# Patient Record
Sex: Female | Born: 1940 | Race: White | Hispanic: No | Smoking: Never smoker
Health system: Southern US, Community
[De-identification: ages and names within clinical notes are randomized; demographics above are authoritative.]

## PROBLEM LIST (undated history)

## (undated) DIAGNOSIS — G4730 Sleep apnea, unspecified: Secondary | ICD-10-CM

## (undated) DIAGNOSIS — M1990 Unspecified osteoarthritis, unspecified site: Secondary | ICD-10-CM

## (undated) DIAGNOSIS — I10 Essential (primary) hypertension: Secondary | ICD-10-CM

## (undated) DIAGNOSIS — E039 Hypothyroidism, unspecified: Secondary | ICD-10-CM

## (undated) DIAGNOSIS — E7800 Pure hypercholesterolemia, unspecified: Secondary | ICD-10-CM

## (undated) HISTORY — PX: JOINT REPLACEMENT: SHX530

## (undated) HISTORY — PX: HAND SURGERY: SHX662

## (undated) HISTORY — PX: TONSILLECTOMY: SUR1361

---

## 2000-12-25 ENCOUNTER — Encounter: Payer: Self-pay | Admitting: General Surgery

## 2000-12-26 ENCOUNTER — Encounter (INDEPENDENT_AMBULATORY_CARE_PROVIDER_SITE_OTHER): Payer: Self-pay | Admitting: Specialist

## 2000-12-26 ENCOUNTER — Observation Stay (HOSPITAL_COMMUNITY): Admission: RE | Admit: 2000-12-26 | Discharge: 2000-12-27 | Payer: Self-pay | Admitting: General Surgery

## 2003-06-20 ENCOUNTER — Encounter: Admission: RE | Admit: 2003-06-20 | Discharge: 2003-07-19 | Payer: Self-pay | Admitting: *Deleted

## 2003-08-15 ENCOUNTER — Other Ambulatory Visit: Admission: RE | Admit: 2003-08-15 | Discharge: 2003-08-15 | Payer: Self-pay | Admitting: *Deleted

## 2003-09-28 ENCOUNTER — Ambulatory Visit (HOSPITAL_COMMUNITY): Admission: RE | Admit: 2003-09-28 | Discharge: 2003-09-28 | Payer: Self-pay | Admitting: Gastroenterology

## 2006-01-07 ENCOUNTER — Other Ambulatory Visit: Admission: RE | Admit: 2006-01-07 | Discharge: 2006-01-07 | Payer: Self-pay | Admitting: *Deleted

## 2007-07-17 ENCOUNTER — Emergency Department (HOSPITAL_COMMUNITY): Admission: EM | Admit: 2007-07-17 | Discharge: 2007-07-17 | Payer: Self-pay | Admitting: Emergency Medicine

## 2008-09-04 ENCOUNTER — Emergency Department (HOSPITAL_COMMUNITY): Admission: EM | Admit: 2008-09-04 | Discharge: 2008-09-04 | Payer: Self-pay | Admitting: Emergency Medicine

## 2009-12-27 ENCOUNTER — Inpatient Hospital Stay (HOSPITAL_COMMUNITY): Admission: AD | Admit: 2009-12-27 | Discharge: 2010-01-01 | Payer: Self-pay | Admitting: Orthopedic Surgery

## 2010-02-08 ENCOUNTER — Encounter: Admission: RE | Admit: 2010-02-08 | Discharge: 2010-05-01 | Payer: Self-pay | Admitting: Orthopedic Surgery

## 2010-02-21 ENCOUNTER — Ambulatory Visit (HOSPITAL_COMMUNITY): Admission: RE | Admit: 2010-02-21 | Discharge: 2010-02-21 | Payer: Self-pay | Admitting: Orthopedic Surgery

## 2011-03-09 LAB — BASIC METABOLIC PANEL
BUN: 11 mg/dL (ref 6–23)
BUN: 9 mg/dL (ref 6–23)
BUN: 9 mg/dL (ref 6–23)
CO2: 27 mEq/L (ref 19–32)
CO2: 28 mEq/L (ref 19–32)
CO2: 29 mEq/L (ref 19–32)
CO2: 31 mEq/L (ref 19–32)
Calcium: 8.3 mg/dL — ABNORMAL LOW (ref 8.4–10.5)
Calcium: 8.5 mg/dL (ref 8.4–10.5)
Chloride: 100 mEq/L (ref 96–112)
Chloride: 103 mEq/L (ref 96–112)
Chloride: 97 mEq/L (ref 96–112)
Chloride: 99 mEq/L (ref 96–112)
Creatinine, Ser: 0.64 mg/dL (ref 0.4–1.2)
Creatinine, Ser: 0.68 mg/dL (ref 0.4–1.2)
GFR calc Af Amer: >60 mL/min (ref >60)
GFR calc non Af Amer: >60 mL/min (ref >60)
Glucose, Bld: 110 mg/dL — ABNORMAL HIGH (ref 70–99)
Glucose, Bld: 118 mg/dL — ABNORMAL HIGH (ref 70–99)
Glucose, Bld: 122 mg/dL — ABNORMAL HIGH (ref 70–99)
Potassium: 3.8 mEq/L (ref 3.5–5.1)
Potassium: 3.9 mEq/L (ref 3.5–5.1)
Sodium: 132 mEq/L — ABNORMAL LOW (ref 135–145)
Sodium: 136 mEq/L (ref 135–145)
Sodium: 136 mEq/L (ref 135–145)

## 2011-03-09 LAB — TYPE AND SCREEN
ABO/RH(D): A NEG
Antibody Screen: NEGATIVE

## 2011-03-09 LAB — HEMOGLOBIN AND HEMATOCRIT, BLOOD
HCT: 27.3 % — ABNORMAL LOW (ref 36.0–46.0)
HCT: 29.8 % — ABNORMAL LOW (ref 36.0–46.0)
HCT: 33.6 % — ABNORMAL LOW (ref 36.0–46.0)
Hemoglobin: 11.4 g/dL — ABNORMAL LOW (ref 12.0–15.0)

## 2011-03-09 LAB — ABO/RH: ABO/RH(D): A NEG

## 2011-03-09 LAB — PROTIME-INR
INR: 1.46 (ref 0.00–1.49)
Prothrombin Time: 16.1 seconds — ABNORMAL HIGH (ref 11.6–15.2)
Prothrombin Time: 16.9 seconds — ABNORMAL HIGH (ref 11.6–15.2)

## 2011-03-15 LAB — COMPREHENSIVE METABOLIC PANEL
Albumin: 3.8 g/dL (ref 3.5–5.2)
Alkaline Phosphatase: 52 U/L (ref 39–117)
BUN: 13 mg/dL (ref 6–23)
Calcium: 9.2 mg/dL (ref 8.4–10.5)
Glucose, Bld: 113 mg/dL — ABNORMAL HIGH (ref 70–99)
Potassium: 4 mEq/L (ref 3.5–5.1)
Sodium: 138 mEq/L (ref 135–145)
Total Protein: 7.6 g/dL (ref 6.0–8.3)

## 2011-03-15 LAB — DIFFERENTIAL
Basophils Relative: 1 % (ref 0–1)
Lymphs Abs: 1.8 10*3/uL (ref 0.7–4.0)
Monocytes Absolute: 0.5 10*3/uL (ref 0.1–1.0)
Monocytes Relative: 7 % (ref 3–12)
Neutro Abs: 4 10*3/uL (ref 1.7–7.7)
Neutrophils Relative %: 62 % (ref 43–77)

## 2011-03-15 LAB — CBC
HCT: 35.8 % — ABNORMAL LOW (ref 36.0–46.0)
MCHC: 33.7 g/dL (ref 30.0–36.0)
Platelets: 256 10*3/uL (ref 150–400)
RDW: 15.1 % (ref 11.5–15.5)

## 2011-03-15 LAB — URINE MICROSCOPIC-ADD ON

## 2011-03-15 LAB — URINALYSIS, ROUTINE W REFLEX MICROSCOPIC
Glucose, UA: NEGATIVE mg/dL
Leukocytes, UA: NEGATIVE
Nitrite: NEGATIVE
pH: 6.5 (ref 5.0–8.0)

## 2011-03-15 LAB — APTT: aPTT: 32 seconds (ref 24–37)

## 2011-03-15 LAB — PROTIME-INR: INR: 1.06 (ref 0.00–1.49)

## 2011-03-25 LAB — DIFFERENTIAL
Basophils Absolute: 0.1 10*3/uL (ref 0.0–0.1)
Basophils Relative: 1 % (ref 0–1)
Eosinophils Absolute: 0.2 10*3/uL (ref 0.0–0.7)
Monocytes Relative: 8 % (ref 3–12)
Neutro Abs: 3.6 10*3/uL (ref 1.7–7.7)
Neutrophils Relative %: 55 % (ref 43–77)

## 2011-03-25 LAB — COMPREHENSIVE METABOLIC PANEL
ALT: 47 U/L — ABNORMAL HIGH (ref 0–35)
Alkaline Phosphatase: 49 U/L (ref 39–117)
BUN: 14 mg/dL (ref 6–23)
CO2: 26 mEq/L (ref 19–32)
Chloride: 105 mEq/L (ref 96–112)
Glucose, Bld: 115 mg/dL — ABNORMAL HIGH (ref 70–99)
Potassium: 4 mEq/L (ref 3.5–5.1)
Sodium: 137 mEq/L (ref 135–145)
Total Bilirubin: 0.9 mg/dL (ref 0.3–1.2)
Total Protein: 7.7 g/dL (ref 6.0–8.3)

## 2011-03-25 LAB — URINALYSIS, ROUTINE W REFLEX MICROSCOPIC
Bilirubin Urine: NEGATIVE
Ketones, ur: NEGATIVE mg/dL
Protein, ur: NEGATIVE mg/dL
Specific Gravity, Urine: 1.013 (ref 1.005–1.030)
Urobilinogen, UA: 0.2 mg/dL (ref 0.0–1.0)

## 2011-03-25 LAB — PROTIME-INR: INR: 0.95 (ref 0.00–1.49)

## 2011-03-25 LAB — CBC
HCT: 36.8 % (ref 36.0–46.0)
Hemoglobin: 12.6 g/dL (ref 12.0–15.0)
RBC: 4.36 MIL/uL (ref 3.87–5.11)
RDW: 14.1 % (ref 11.5–15.5)
WBC: 6.5 10*3/uL (ref 4.0–10.5)

## 2011-03-25 LAB — URINE MICROSCOPIC-ADD ON

## 2011-05-10 NOTE — Op Note (Signed)
   NAME:  Mary Hayes, HERRIG NO.:  [id number]   MEDICAL RECORD NO.:  [id number]                   PATIENT TYPE:  AMB   LOCATION:  ENDO                                 FACILITY:  [facility]   PHYSICIAN:  James L. Malon Kindle., M.D.          DATE OF BIRTH:  August 18, 1941   DATE OF PROCEDURE:  09/28/2003  DATE OF DISCHARGE:                                 OPERATIVE REPORT   PROCEDURE:  Colonoscopy.   MEDICATIONS:  Fentanyl 75 mcg, Versed 6 mg IV.   SCOPE:  Olympus pediatric colonoscope.   INDICATIONS FOR PROCEDURE:  Colon cancer screening.   DESCRIPTION OF PROCEDURE:  The procedure had been explained to the patient  and consent obtained. With the patient in the left lateral decubitus  position, the Olympus pediatric video colonoscope was inserted and advanced.  We were able to advance to the cecum without difficulty. The ileocecal valve  and appendiceal orifice were seen. The terminal ileum was entered for a  short distance and was normal. The scope was withdrawn and the cecum,  ascending colon,  transverse colon, descending and sigmoid colon were seen  well, no polyps were seen. No significant diverticular disease. The rectum  was free of polyps.  The scope was withdrawn. The patient tolerated the  procedure well and was maintained on low flow oxygen and pulse oximeter  throughout the procedure.   ASSESSMENT:  Normal screening colonoscopy.   PLAN:  Will recommend yearly Hemoccults and consider another exam in 5-10  years.                                               James L. Malon Kindle., M.D.    Waldron Session  D:  09/28/2003  T:  09/28/2003  Job:  [id number]   cc:   Al Decant. Janey Greaser, MD  [address]  [place]  [place] [address]  Fax: [phone]

## 2011-05-10 NOTE — Op Note (Signed)
[facility]  Patient:    Mary Hayes, Mary Hayes                      MRN: [id number] Proc. Date: 12/26/00 Adm. Date:  [id number] Attending:  Tempie Donning                           Operative Report  OPERATIVE PROCEDURE:  Laparoscopic cholecystectomy.  SURGEON:  Gita Kudo, M.D.  ASSISTANT:  Lorne Skeens. Hoxworth, M.D.  ANESTHESIA:  General endotracheal.  PREOPERATIVE DIAGNOSIS:  Cholecystitis.  POSTOPERATIVE DIAGNOSIS:  Cholecystitis.  CLINICAL SUMMARY:  A 70 year old female admitted for elective cholecystectomy. She has had abdominal pain.  Her gallbladder ultrasound shows stones.  Liver function studies are normal.  OPERATIVE FINDINGS:  The gallbladder was thin walled and had a fair amount of fat around it, as well as the liver looking fatty.  The cystic duct and artery were normal in size and anatomy.  DESCRIPTION OF PROCEDURE:  Under satisfactory general endotracheal anesthesia, the patients abdomen was prepped and draped in a standard fashion.  She received 1.0 g Ancef preoperatively.  A transverse incision was made above the umbilicus in the midline and opened into the peritoneum.  Controlled with a figure-of-eight 0 Vicryl suture and operating Hasson port inserted and secured.  Good CO2 pneumoperitoneum was established and a cannula was placed. Then through sites infiltrated with Marcaine, two #5 ports were placed laterally and a second #10 port medially.  Graspers in the lateral port gave excellent exposure and operating through the medial port, I carefully identified the cystic duct/gallbladder junction.  Careful dissection with a right angle clamp was used to make sure of the anatomy.  Then the cystic duct and artery were controlled with multiple metal clips and divided between the distal two.  The gallbladder was then removed from below upward using the coagulating spatula for hemostasis and dissection.  A few pinpoint holes  were made in the gallbladder and the dissection was continued.  There was very minimal spillage of clear bile without any stone. After the gallbladder was removed from the liver bed, an endocatch bag was placed into the abdomen and used to hold the gallbladder securely from further spillage.  Then the liver bulb was checked for hemostasis which was made dry by cautery and the abdomen lavaged with a liter of warm saline.  The gallbladder was then removed through the abdominal port after the camera was moved to the upper port.  This was done without spillage or problem.  A second liter of warm fluid was then used to irrigate the abdomen and the returns were totally clear.  Then the ports were removed under direct vision and the CO2 released.  The midline incision was then infiltrated with Marcaine and closed with the previously placed figure-of-eight suture, as well as a second interrupted 0 Vicryl suture.  Then the subcu was approximated with 4-0 Vicryl and Steri-Strips placed in all wounds.  A sterile absorbent dressing was then applied.  The patient went to the recovery room from the operating room in good condition.  The sponge and needle counts were correct. DD:  12/26/00 TD:  12/26/00 Job: [id number]

## 2011-06-13 ENCOUNTER — Other Ambulatory Visit (HOSPITAL_COMMUNITY): Payer: Self-pay | Admitting: Orthopedic Surgery

## 2011-06-13 DIAGNOSIS — T84038A Mechanical loosening of other internal prosthetic joint, initial encounter: Secondary | ICD-10-CM

## 2011-06-13 DIAGNOSIS — M25561 Pain in right knee: Secondary | ICD-10-CM

## 2011-06-18 ENCOUNTER — Encounter (HOSPITAL_COMMUNITY)
Admission: RE | Admit: 2011-06-18 | Discharge: 2011-06-18 | Disposition: A | Discharge status: Home / Self Care | Payer: Medicare Other | Source: Ambulatory Visit | Attending: Orthopedic Surgery | Admitting: Orthopedic Surgery

## 2011-06-18 DIAGNOSIS — M25569 Pain in unspecified knee: Secondary | ICD-10-CM | POA: Insufficient documentation

## 2011-06-18 DIAGNOSIS — T84039A Mechanical loosening of unspecified internal prosthetic joint, initial encounter: Secondary | ICD-10-CM | POA: Insufficient documentation

## 2011-06-18 DIAGNOSIS — M25561 Pain in right knee: Secondary | ICD-10-CM

## 2011-06-18 DIAGNOSIS — T84038A Mechanical loosening of other internal prosthetic joint, initial encounter: Secondary | ICD-10-CM

## 2011-06-18 DIAGNOSIS — Z96659 Presence of unspecified artificial knee joint: Secondary | ICD-10-CM | POA: Insufficient documentation

## 2011-06-18 MED ORDER — TECHNETIUM TC 99M MEDRONATE IV KIT
25.0000 | PACK | Freq: Once | INTRAVENOUS | Status: AC | PRN
  Administered 2011-06-18: 25.9 via INTRAVENOUS

## 2013-10-22 ENCOUNTER — Other Ambulatory Visit: Payer: Self-pay | Admitting: Family Medicine

## 2013-10-27 ENCOUNTER — Ambulatory Visit
Admission: RE | Admit: 2013-10-27 | Discharge: 2013-10-27 | Disposition: A | Discharge status: Home / Self Care | Payer: Medicare Other | Source: Ambulatory Visit | Attending: Family Medicine | Admitting: Family Medicine

## 2016-02-19 ENCOUNTER — Emergency Department (HOSPITAL_COMMUNITY)
Admission: EM | Admit: 2016-02-19 | Discharge: 2016-02-19 | Disposition: A | Discharge status: Home / Self Care | Payer: Self-pay | Attending: Emergency Medicine | Admitting: Emergency Medicine

## 2016-02-19 ENCOUNTER — Encounter (HOSPITAL_COMMUNITY): Payer: Self-pay | Admitting: Emergency Medicine

## 2016-02-19 DIAGNOSIS — Y9289 Other specified places as the place of occurrence of the external cause: Secondary | ICD-10-CM | POA: Insufficient documentation

## 2016-02-19 DIAGNOSIS — Y998 Other external cause status: Secondary | ICD-10-CM | POA: Insufficient documentation

## 2016-02-19 DIAGNOSIS — I10 Essential (primary) hypertension: Secondary | ICD-10-CM | POA: Insufficient documentation

## 2016-02-19 DIAGNOSIS — Y9389 Activity, other specified: Secondary | ICD-10-CM | POA: Insufficient documentation

## 2016-02-19 DIAGNOSIS — W540XXA Bitten by dog, initial encounter: Secondary | ICD-10-CM | POA: Insufficient documentation

## 2016-02-19 DIAGNOSIS — S61451A Open bite of right hand, initial encounter: Secondary | ICD-10-CM | POA: Insufficient documentation

## 2016-02-19 HISTORY — DX: Pure hypercholesterolemia, unspecified: E78.00

## 2016-02-19 HISTORY — DX: Unspecified osteoarthritis, unspecified site: M19.90

## 2016-02-19 HISTORY — DX: Essential (primary) hypertension: I10

## 2016-02-19 NOTE — ED Notes (Addendum)
Called for Pt in lobby.  No response.

## 2016-02-19 NOTE — ED Notes (Signed)
RN called for Pt in lobby with no response

## 2016-02-19 NOTE — ED Notes (Signed)
Called for Pt in lobby with no response

## 2016-02-19 NOTE — ED Notes (Signed)
Pt presents with dog bite to right palm, 1" laceration noted to palmar surface, bleeding is controlled at this time.  No additional injury.  Dog is vaccinated per pt report.

## 2016-12-24 DIAGNOSIS — Z789 Other specified health status: Secondary | ICD-10-CM | POA: Diagnosis not present

## 2016-12-24 DIAGNOSIS — I10 Essential (primary) hypertension: Secondary | ICD-10-CM | POA: Diagnosis not present

## 2017-01-15 DIAGNOSIS — I10 Essential (primary) hypertension: Secondary | ICD-10-CM | POA: Diagnosis not present

## 2017-01-15 DIAGNOSIS — M25511 Pain in right shoulder: Secondary | ICD-10-CM | POA: Diagnosis not present

## 2017-01-31 DIAGNOSIS — G4733 Obstructive sleep apnea (adult) (pediatric): Secondary | ICD-10-CM | POA: Diagnosis not present

## 2017-02-04 DIAGNOSIS — M25511 Pain in right shoulder: Secondary | ICD-10-CM | POA: Diagnosis not present

## 2017-02-04 DIAGNOSIS — M12811 Other specific arthropathies, not elsewhere classified, right shoulder: Secondary | ICD-10-CM | POA: Diagnosis not present

## 2017-02-04 DIAGNOSIS — M19011 Primary osteoarthritis, right shoulder: Secondary | ICD-10-CM | POA: Diagnosis not present

## 2017-02-19 DIAGNOSIS — I10 Essential (primary) hypertension: Secondary | ICD-10-CM | POA: Diagnosis not present

## 2017-03-03 DIAGNOSIS — M12811 Other specific arthropathies, not elsewhere classified, right shoulder: Secondary | ICD-10-CM | POA: Diagnosis not present

## 2017-04-24 DIAGNOSIS — E559 Vitamin D deficiency, unspecified: Secondary | ICD-10-CM | POA: Diagnosis not present

## 2017-04-24 DIAGNOSIS — R899 Unspecified abnormal finding in specimens from other organs, systems and tissues: Secondary | ICD-10-CM | POA: Diagnosis not present

## 2017-04-24 DIAGNOSIS — R311 Benign essential microscopic hematuria: Secondary | ICD-10-CM | POA: Diagnosis not present

## 2017-04-24 DIAGNOSIS — R7301 Impaired fasting glucose: Secondary | ICD-10-CM | POA: Diagnosis not present

## 2017-04-24 DIAGNOSIS — M1990 Unspecified osteoarthritis, unspecified site: Secondary | ICD-10-CM | POA: Diagnosis not present

## 2017-04-24 DIAGNOSIS — I10 Essential (primary) hypertension: Secondary | ICD-10-CM | POA: Diagnosis not present

## 2017-04-24 DIAGNOSIS — M810 Age-related osteoporosis without current pathological fracture: Secondary | ICD-10-CM | POA: Diagnosis not present

## 2017-04-24 DIAGNOSIS — Z0000 Encounter for general adult medical examination without abnormal findings: Secondary | ICD-10-CM | POA: Diagnosis not present

## 2017-04-24 DIAGNOSIS — G4730 Sleep apnea, unspecified: Secondary | ICD-10-CM | POA: Diagnosis not present

## 2017-04-24 DIAGNOSIS — E782 Mixed hyperlipidemia: Secondary | ICD-10-CM | POA: Diagnosis not present

## 2017-04-24 DIAGNOSIS — K760 Fatty (change of) liver, not elsewhere classified: Secondary | ICD-10-CM | POA: Diagnosis not present

## 2017-05-07 DIAGNOSIS — G4733 Obstructive sleep apnea (adult) (pediatric): Secondary | ICD-10-CM | POA: Diagnosis not present

## 2017-05-21 DIAGNOSIS — M1990 Unspecified osteoarthritis, unspecified site: Secondary | ICD-10-CM | POA: Diagnosis not present

## 2017-05-21 DIAGNOSIS — R220 Localized swelling, mass and lump, head: Secondary | ICD-10-CM | POA: Diagnosis not present

## 2017-06-04 DIAGNOSIS — M810 Age-related osteoporosis without current pathological fracture: Secondary | ICD-10-CM | POA: Diagnosis not present

## 2017-08-01 DIAGNOSIS — Z1231 Encounter for screening mammogram for malignant neoplasm of breast: Secondary | ICD-10-CM | POA: Diagnosis not present

## 2017-08-06 DIAGNOSIS — M810 Age-related osteoporosis without current pathological fracture: Secondary | ICD-10-CM | POA: Diagnosis not present

## 2017-11-07 DIAGNOSIS — M810 Age-related osteoporosis without current pathological fracture: Secondary | ICD-10-CM | POA: Diagnosis not present

## 2017-11-11 DIAGNOSIS — G4733 Obstructive sleep apnea (adult) (pediatric): Secondary | ICD-10-CM | POA: Diagnosis not present

## 2017-11-18 DIAGNOSIS — M79604 Pain in right leg: Secondary | ICD-10-CM | POA: Diagnosis not present

## 2017-11-21 DIAGNOSIS — M1712 Unilateral primary osteoarthritis, left knee: Secondary | ICD-10-CM | POA: Diagnosis not present

## 2017-11-21 DIAGNOSIS — M5137 Other intervertebral disc degeneration, lumbosacral region: Secondary | ICD-10-CM | POA: Diagnosis not present

## 2017-11-21 DIAGNOSIS — M5416 Radiculopathy, lumbar region: Secondary | ICD-10-CM | POA: Diagnosis not present

## 2017-12-05 DIAGNOSIS — M1711 Unilateral primary osteoarthritis, right knee: Secondary | ICD-10-CM | POA: Diagnosis not present

## 2017-12-05 DIAGNOSIS — M5137 Other intervertebral disc degeneration, lumbosacral region: Secondary | ICD-10-CM | POA: Diagnosis not present

## 2017-12-05 DIAGNOSIS — M1712 Unilateral primary osteoarthritis, left knee: Secondary | ICD-10-CM | POA: Diagnosis not present

## 2017-12-12 ENCOUNTER — Emergency Department (HOSPITAL_COMMUNITY): Payer: PPO

## 2017-12-12 ENCOUNTER — Emergency Department (HOSPITAL_COMMUNITY)
Admission: EM | Admit: 2017-12-12 | Discharge: 2017-12-12 | Disposition: A | Discharge status: Home / Self Care | Payer: PPO | Attending: Emergency Medicine | Admitting: Emergency Medicine

## 2017-12-12 ENCOUNTER — Other Ambulatory Visit: Payer: Self-pay

## 2017-12-12 ENCOUNTER — Encounter (HOSPITAL_COMMUNITY): Payer: Self-pay | Admitting: Emergency Medicine

## 2017-12-12 DIAGNOSIS — Z79899 Other long term (current) drug therapy: Secondary | ICD-10-CM | POA: Insufficient documentation

## 2017-12-12 DIAGNOSIS — I10 Essential (primary) hypertension: Secondary | ICD-10-CM | POA: Diagnosis not present

## 2017-12-12 DIAGNOSIS — M542 Cervicalgia: Secondary | ICD-10-CM

## 2017-12-12 DIAGNOSIS — M545 Low back pain: Secondary | ICD-10-CM | POA: Diagnosis not present

## 2017-12-12 LAB — CBC WITH DIFFERENTIAL/PLATELET
BASOS PCT: 0 %
Basophils Absolute: 0 10*3/uL (ref 0.0–0.1)
EOS ABS: 0.1 10*3/uL (ref 0.0–0.7)
Eosinophils Relative: 1 %
HCT: 38 % (ref 36.0–46.0)
Hemoglobin: 12.8 g/dL (ref 12.0–15.0)
LYMPHS ABS: 2.7 10*3/uL (ref 0.7–4.0)
Lymphocytes Relative: 20 %
MCH: 29 pg (ref 26.0–34.0)
MCHC: 33.7 g/dL (ref 30.0–36.0)
MCV: 86.2 fL (ref 78.0–100.0)
MONO ABS: 1.5 10*3/uL — AB (ref 0.1–1.0)
MONOS PCT: 11 %
NEUTROS PCT: 68 %
Neutro Abs: 9.5 10*3/uL — ABNORMAL HIGH (ref 1.7–7.7)
Platelets: 255 10*3/uL (ref 150–400)
RBC: 4.41 MIL/uL (ref 3.87–5.11)
RDW: 13.6 % (ref 11.5–15.5)
WBC: 13.8 10*3/uL — ABNORMAL HIGH (ref 4.0–10.5)

## 2017-12-12 LAB — MAGNESIUM: Magnesium: 2 mg/dL (ref 1.7–2.4)

## 2017-12-12 LAB — BASIC METABOLIC PANEL
Anion gap: 8 (ref 5–15)
BUN: 11 mg/dL (ref 6–20)
CALCIUM: 8.9 mg/dL (ref 8.9–10.3)
CO2: 25 mmol/L (ref 22–32)
CREATININE: 0.62 mg/dL (ref 0.44–1.00)
Chloride: 104 mmol/L (ref 101–111)
GFR calc Af Amer: >60 mL/min (ref >60)
GFR calc non Af Amer: >60 mL/min (ref >60)
Glucose, Bld: 140 mg/dL — ABNORMAL HIGH (ref 65–99)
Potassium: 3.9 mmol/L (ref 3.5–5.1)
SODIUM: 137 mmol/L (ref 135–145)

## 2017-12-12 MED ORDER — ACETAMINOPHEN 500 MG PO TABS
1000.0000 mg | ORAL_TABLET | Freq: Once | ORAL | Status: AC
  Administered 2017-12-12: 1000 mg via ORAL
  Filled 2017-12-12: qty 2

## 2017-12-12 MED ORDER — GADOBENATE DIMEGLUMINE 529 MG/ML IV SOLN: 20.0000 mL | Freq: Once | INTRAVENOUS | Status: DC | PRN

## 2017-12-12 MED ORDER — KETOROLAC TROMETHAMINE 30 MG/ML IJ SOLN
30.0000 mg | Freq: Once | INTRAMUSCULAR | Status: AC
  Administered 2017-12-12: 30 mg via INTRAVENOUS
  Filled 2017-12-12: qty 1

## 2017-12-12 MED ORDER — HYDROMORPHONE HCL 1 MG/ML IJ SOLN: 1.0000 mg | Freq: Once | INTRAMUSCULAR | Status: DC

## 2017-12-12 MED ORDER — METHYLPREDNISOLONE 4 MG PO TBPK
ORAL_TABLET | ORAL | 0 refills | Status: DC
Start: 2017-12-12 — End: 2019-01-13

## 2017-12-12 MED ORDER — DEXAMETHASONE SODIUM PHOSPHATE 10 MG/ML IJ SOLN
10.0000 mg | Freq: Once | INTRAMUSCULAR | Status: AC
  Administered 2017-12-12: 10 mg via INTRAVENOUS
  Filled 2017-12-12: qty 1

## 2017-12-12 MED ORDER — HYDROMORPHONE HCL 1 MG/ML IJ SOLN
0.5000 mg | Freq: Once | INTRAMUSCULAR | Status: AC
  Administered 2017-12-12: 0.5 mg via INTRAVENOUS
  Filled 2017-12-12: qty 1

## 2017-12-12 MED ORDER — METHOCARBAMOL 500 MG PO TABS: 500.0000 mg | ORAL_TABLET | Freq: Two times a day (BID) | ORAL | 0 refills | Status: DC

## 2017-12-12 MED ORDER — METHOCARBAMOL 500 MG PO TABS
1000.0000 mg | ORAL_TABLET | Freq: Once | ORAL | Status: AC
  Administered 2017-12-12: 1000 mg via ORAL
  Filled 2017-12-12: qty 2

## 2017-12-12 MED ORDER — NAPROXEN 375 MG PO TABS: 375.0000 mg | ORAL_TABLET | Freq: Two times a day (BID) | ORAL | 0 refills | Status: DC

## 2017-12-12 NOTE — ED Notes (Signed)
Pt transported to MRI 

## 2017-12-12 NOTE — ED Provider Notes (Signed)
This is a 76 year old female signed out to me at change of shift by Abigail Butts with MRI pending.  Please see her note for more details on history, physical, review of systems and medical decision making.  Briefly this is a 76 year old female presenting for bilateral neck pain times 2 days.  She reports that initially began on the left side but yesterday spread to the right side.  She has been using the lidocaine patches, arthritis cream, tramadol and Zanaflex without relief.  She is also tried heat and ice along with stretching.  She is now unable to turn her head due to the pain.  There is noted to be bilateral paraspinal tenderness.  No neurologic deficits noted and patient has intact strength of upper and lower extremities.  No loss of bowel or bladder.  No urinary retention.  X-ray of cervical spine negative.  MRI obtained for potential epidural hematoma or abscess.   Physical Exam  BP (!) 168/76 (BP Location: Right Arm)   Pulse 99   Temp 99.1 F (37.3 C) (Oral)   Resp 20   Ht 5\' 6"  (1.676 m)   Wt 88.5 kg (195 lb)   SpO2 98%   BMI 31.47 kg/m   Physical Exam  Constitutional: She appears well-developed and well-nourished. No distress.  Non-toxic appearing  HENT:  Head: Normocephalic and atraumatic.  Right Ear: External ear normal.  Left Ear: External ear normal.  Neck:  No Bruits  Cardiovascular: Normal rate, regular rhythm, normal heart sounds and intact distal pulses.  No murmur heard. Pulses:      Radial pulses are 2+ on the right side, and 2+ on the left side.       Femoral pulses are 2+ on the right side, and 2+ on the left side.      Dorsalis pedis pulses are 2+ on the right side, and 2+ on the left side.       Posterior tibial pulses are 2+ on the right side, and 2+ on the left side.  Pulmonary/Chest: Effort normal and breath sounds normal. No respiratory distress.  Abdominal: Soft. Bowel sounds are normal. She exhibits no pulsatile midline mass. There is no  tenderness. There is no rigidity, no rebound and no CVA tenderness.  Musculoskeletal:  Posterior and appearance appears normal. No evidence of obvious scoliosis or kyphosis. No obvious signs of skin changes, trauma, deformity, infection. No C spine tenderness or step-offs to palpation. Bilateral paraspinal TTP. Lung expansion normal. Bilateral lower extremity strength 5 out of 5. Upper extremity strength 5/5. Bicceps, Patellar and Achilles deep tendon reflex 2+ and equal bilaterally. Sensation of upper and lower extremities grossly intact. Eextremity compartments soft. Radial, PT and DP 2+ b/l. Cap refill <2 seconds.   Neurological: She is alert.  Skin: Skin is warm, dry and intact. Capillary refill takes less than 2 seconds. No rash noted. She is not diaphoretic. No erythema.  Nursing note and vitals reviewed.   ED Course/Procedures     Procedures Results for orders placed or performed during the hospital encounter of 12/12/17  CBC with Differential  Result Value Ref Range   WBC 13.8 (H) 4.0 - 10.5 K/uL   RBC 4.41 3.87 - 5.11 MIL/uL   Hemoglobin 12.8 12.0 - 15.0 g/dL   HCT 38.0 36.0 - 46.0 %   MCV 86.2 78.0 - 100.0 fL   MCH 29.0 26.0 - 34.0 pg   MCHC 33.7 30.0 - 36.0 g/dL   RDW 13.6 11.5 - 15.5 %  Platelets 255 150 - 400 K/uL   Neutrophils Relative % 68 %   Neutro Abs 9.5 (H) 1.7 - 7.7 K/uL   Lymphocytes Relative 20 %   Lymphs Abs 2.7 0.7 - 4.0 K/uL   Monocytes Relative 11 %   Monocytes Absolute 1.5 (H) 0.1 - 1.0 K/uL   Eosinophils Relative 1 %   Eosinophils Absolute 0.1 0.0 - 0.7 K/uL   Basophils Relative 0 %   Basophils Absolute 0.0 0.0 - 0.1 K/uL  Basic metabolic panel  Result Value Ref Range   Sodium 137 135 - 145 mmol/L   Potassium 3.9 3.5 - 5.1 mmol/L   Chloride 104 101 - 111 mmol/L   CO2 25 22 - 32 mmol/L   Glucose, Bld 140 (H) 65 - 99 mg/dL   BUN 11 6 - 20 mg/dL   Creatinine, Ser 0.62 0.44 - 1.00 mg/dL   Calcium 8.9 8.9 - 10.3 mg/dL   GFR calc non Af Amer >60 >60  mL/min   GFR calc Af Amer >60 >60 mL/min   Anion gap 8 5 - 15  Magnesium  Result Value Ref Range   Magnesium 2.0 1.7 - 2.4 mg/dL   Dg Cervical Spine Complete  Result Date: 12/12/2017 CLINICAL DATA:  Torticollis.  Posterior neck pain for 2 days. EXAM: CERVICAL SPINE - COMPLETE 4+ VIEW COMPARISON:  None. FINDINGS: Cervical spine alignment is maintained. Vertebral body heights are preserved. The dens is intact. Disc space narrowing and endplate spurring Z0-C5 and C6-C7. Multilevel facet arthropathy. Mild bony neural foraminal stenosis at C3-C4 on the right and C4-C5 on the left. There is no evidence of fracture. No prevertebral soft tissue edema. 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 No evidence acute abnormality. Electronically Signed   By: Jeb Levering M.D.   On: 12/12/2017 06:18   Dg Lumbar Spine Complete  Result Date: 12/12/2017 CLINICAL DATA:  Radicular pain in the right leg. New onset back pain 2 days ago. No known injury. EXAM: LUMBAR SPINE - COMPLETE 4+ VIEW COMPARISON:  Reformats from abdominal CT 11/05/2010 FINDINGS: T12 compression fracture. Lumbar vertebral body heights are preserved. Multilevel endplate spurring with preservation of disc spaces. Advanced facet arthropathy in the lower lumbar spine. No evidence of acute fracture. The bones are under mineralized. Sacroiliac joints are congruent with degenerative change. IMPRESSION: 1. T12 compression fracture, chronic and present on prior abdominal CT, however difficult to completely assess given osteopenia and lung overlap on the lateral view. 2. Advanced facet arthropathy in the lower lumbar spine. Electronically Signed   By: Jeb Levering M.D.   On: 12/12/2017 06:16   Mr Cervical Spine Wo Contrast  Result Date: 12/12/2017 CLINICAL DATA:  Onset of bilateral neck pain 2 days ago, worsening. No known injury. EXAM: MRI CERVICAL SPINE WITHOUT CONTRAST TECHNIQUE:  Multiplanar, multisequence MR imaging of the cervical spine was performed. No intravenous contrast was administered. COMPARISON:  None. FINDINGS: No sagittal T2 weighted imaging is provided. The patient refused to complete the study. Alignment: Straightening of lordosis is noted. No traumatic listhesis. Vertebrae: No fracture or worrisome lesion. Mild degenerative endplate signal change at C3-4 noted. Cord: Normal signal throughout. Posterior Fossa, vertebral arteries, paraspinal tissues: Negative. Disc levels: C1-2: Degenerative change at the articulation of the dens and C1 is noted. C2-3:  Tiny annular fissure and shallow bulge without stenosis. C3-4: Shallow broad-based disc bulge mildly deforms the ventral cord. The foramina appear open. C4-5: Small central protrusion indents the ventral thecal  sac. Uncovertebral spurring and facet arthropathy on the left cause moderate to moderately severe stenosis. The right foramen is open. C5-6: Shallow broad-based disc bulge slightly deforms the ventral cord. The foramina appear open. C6-7:  Minimal disc bulge without stenosis. C7-T1:  Negative. IMPRESSION: No acute abnormality. Moderate to moderately severe foraminal narrowing on the left at C4-5 due to uncovertebral and facet degeneration. Shallow central protrusion at this level mildly deforms the ventral cord. Shallow broad-based disc bulge at C5-6 mildly deforms the ventral cord. Electronically Signed   By: Inge Rise M.D.   On: 12/12/2017 08:05     MDM   This is a 76 year old female signed out to me at change of shift by Abigail Butts with MRI pending.  Please see her note for more details on history, physical, review of systems and medical decision making.  Briefly this is a 76 year old female presenting for bilateral neck pain times 2 days.  She reports that initially began on the left side but yesterday spread to the right side.  She has been using the lidocaine patches, arthritis cream,  tramadol and Zanaflex without relief.  She is also tried heat and ice along with stretching.  She is now unable to turn her head due to the pain.  There is noted to be bilateral paraspinal tenderness.  No neurologic deficits noted and patient has intact strength of upper and lower extremities.  X-ray of cervical spine negative.  MRI obtained for potential epidural hematoma or abscess.  Plan at time of sign out if MRI is negative give Toradol and Decadron with d/c to PCP/Ortho with Naproxen and Robaxin.   MRI of the cervical spine shows moderate to moderately severe foraminal narrowing at C4-5 due to uncovertebral and facet degeneration.  There is also shallow central protrusion at this level mildly deforming the ventral cord.  There is also a shallow broad-based disc bulge at C5-6 that mildly deforms the ventral cord.  I spoke with Dr. Ellene Route of neurosurgery in regards to this.  He recommended that the patient can be given IV Decadron and Toradol in the emergency department.  He recommended that the patient be discharged home on a Medrol Dosepak and NSAIDs.  He will have the patient follow-up in his office.  I discussed these with the patient and she is in agreement.  I was discussed strict return precautions.  He appears safe for discharge.        Lorelle Gibbs 12/12/17 1456    Palumbo, April, MD 12/13/17 [place]

## 2017-12-12 NOTE — ED Notes (Signed)
Patient transported to MRI 

## 2017-12-12 NOTE — ED Triage Notes (Signed)
Patient is complaining of neck pain. Patient states she can not rest or lay down. Patient states it started yesterday.

## 2017-12-12 NOTE — Discharge Instructions (Signed)
You were seen here for neck pain. An MRI was done and shows Moderate to moderately severe foraminal narrowing on the left at C4-5 due to uncovertebral and facet degeneration. Shallow central protrusion at this level mildly deforms the ventral cord. There is a shallow broad-based disc bulge at C5-6 mildly deforms the ventral Cord.  I spoke with neurosurgery in regards to your MRI. We are going to start you on steriods for this. Please take medrol dose pack as directed. Please stop taking your Zanaflex and start taking Robaxin.   Please take Naproxen as directed for pain.   Please follow up with neurosurgery as above.   Please return if you have any weakness of the upper extremities, numbness/tingling of the upper extremities or new concerning symptoms.

## 2017-12-12 NOTE — ED Provider Notes (Signed)
[facility] DEPT Provider Note   CSN: [id number] Arrival date & time: 12/12/17  0023     History   Chief Complaint Chief Complaint  Patient presents with  . Neck Pain    HPI Mary Hayes is a 76 y.o. female with a hx of arthritis, high cholesterol, HTN presents to the Emergency Department complaining of gradual, persistent, progressively worsening bilateral neck pain onset 2 days ago.  She reports the pain initially began on the left side, but yesterday spread to the right side.  She reports using lidocaine patches, arthritis cream, pain medication (tramadol) and muscle relaxer (Zanaflex) without relief.  She has also attempted ice and heat along with stretching.  She reports she is now unable to turn her head. She came tonight because she was unable to sleep tonight.  She reports mild headache.  Denies fever, chills, weakness, numbness, vision changes, CP, SOB, abd pain, N/V/D, rash.  Pt denies blood thinners, hx of cancer, IVDU, falls or trauma. Last dose of pain medication was 11:30PM. Nothing makes the pain better, movement makes the pain worse.  Patient denies recent international travel or sick contacts.   The history is provided by the patient and medical records.    Past Medical History:  Diagnosis Date  . Arthritis   . Hypercholesteremia   . Hypertension     There are no active problems to display for this patient.   Past Surgical History:  Procedure Laterality Date  . JOINT REPLACEMENT Right     OB History    No data available       Home Medications    Prior to Admission medications   Medication Sig Start Date End Date Taking? Authorizing Provider  acetaminophen (TYLENOL) 650 MG CR tablet Take 1,300 mg by mouth every 8 (eight) hours as needed for pain.   Yes [provider]  atorvastatin (LIPITOR) 10 MG tablet Take 10 mg by mouth daily.   Yes [provider]  Capsaicin (CAPZASIN-HP) 0.1 % CREA Apply 1  application topically 3 (three) times daily as needed. Pain   Yes [provider]  hydrocortisone cream 1 % Apply 1 application topically 3 (three) times daily as needed for itching.   Yes [provider]  losartan (COZAAR) 50 MG tablet Take 50 mg by mouth daily.   Yes [provider]  Multiple Vitamin (MULTIVITAMIN WITH MINERALS) TABS tablet Take 1 tablet by mouth daily.   Yes [provider]  psyllium (HYDROCIL/METAMUCIL) 95 % PACK Take 1 packet by mouth daily.   Yes [provider]  tiZANidine (ZANAFLEX) 4 MG tablet Take 4 mg by mouth every 6 (six) hours as needed for muscle spasms.   Yes [provider]  traMADol (ULTRAM) 50 MG tablet Take 50 mg by mouth every 6 (six) hours as needed for severe pain.   Yes [provider]    Family History History reviewed. No pertinent family history.  Social History Social History   Tobacco Use  . Smoking status: Never Smoker  . Smokeless tobacco: Never Used  Substance Use Topics  . Alcohol use: No  . Drug use: No     Allergies   Patient has no known allergies.   Review of Systems Review of Systems  Constitutional: Negative for appetite change, diaphoresis, fatigue, fever and unexpected weight change.  HENT: Negative for mouth sores.   Eyes: Negative for visual disturbance.  Respiratory: Negative for cough, chest tightness, shortness of breath and wheezing.  Cardiovascular: Negative for chest pain.  Gastrointestinal: Negative for abdominal pain, constipation, diarrhea, nausea and vomiting.  Endocrine: Negative for polydipsia, polyphagia and polyuria.  Genitourinary: Negative for dysuria, frequency, hematuria and urgency.  Musculoskeletal: Positive for neck pain and neck stiffness. Negative for back pain.  Skin: Negative for rash.  Allergic/Immunologic: Negative for immunocompromised state.  Neurological: Negative for syncope, light-headedness and headaches.  Hematological:  Does not bruise/bleed easily.  Psychiatric/Behavioral: Negative for sleep disturbance. The patient is not nervous/anxious.      Physical Exam Updated Vital Signs BP (!) 168/76 (BP Location: Right Arm)   Pulse 99   Temp 99.1 F (37.3 C) (Oral)   Resp 20   Ht 5\' 6"  (1.676 m)   Wt 88.5 kg (195 lb)   SpO2 98%   BMI 31.47 kg/m   Physical Exam  Constitutional: She appears well-developed and well-nourished. No distress.  Awake, alert, nontoxic appearance  HENT:  Head: Normocephalic and atraumatic.  Mouth/Throat: Oropharynx is clear and moist. No oropharyngeal exudate.  Eyes: Conjunctivae are normal. Pupils are equal, round, and reactive to light. No scleral icterus.  Neck: Phonation normal. No JVD present. Muscular tenderness (Bilateral paraspinal and along bilateral sternocleidomastoid) present. No tracheal tenderness and no spinous process tenderness present. Carotid bruit is not present. Decreased range of motion present. No tracheal deviation, no edema and no erythema present. No Brudzinski's sign and no Kernig's sign noted. No thyromegaly present.  Cardiovascular: Normal rate, regular rhythm and intact distal pulses.  Pulses:      Radial pulses are 2+ on the right side, and 2+ on the left side.  Pulmonary/Chest: Effort normal and breath sounds normal. No respiratory distress. She has no wheezes.  Equal chest expansion  Abdominal: Soft. Bowel sounds are normal. She exhibits no mass. There is no tenderness. There is no rebound and no guarding.  Musculoskeletal: She exhibits no edema.  FROM of the T-spine and L-spine without pain.  Pt is able to move from laying to sitting with some pain but without difficulty.  Neurological: She is alert.  Speech is clear and goal oriented Moves extremities without ataxia Strength 5/5 in the bilateral upper and lower extremities Sensation intact and normal touch bilaterally throughout the upper and lower extremity  Skin: Skin is warm and dry. She  is not diaphoretic.  Psychiatric: She has a normal mood and affect.  Nursing note and vitals reviewed.    ED Treatments / Results  Labs (all labs ordered are listed, but only abnormal results are displayed) Labs Reviewed  CBC WITH DIFFERENTIAL/PLATELET - Abnormal; Notable for the following components:      Result Value   WBC 13.8 (*)    Neutro Abs 9.5 (*)    Monocytes Absolute 1.5 (*)    All other components within normal limits  BASIC METABOLIC PANEL - Abnormal; Notable for the following components:   Glucose, Bld 140 (*)    All other components within normal limits  MAGNESIUM     Radiology Dg Cervical Spine Complete  Result Date: 12/12/2017 CLINICAL DATA:  Torticollis.  Posterior neck pain for 2 days. EXAM: CERVICAL SPINE - COMPLETE 4+ VIEW COMPARISON:  None. FINDINGS: Cervical spine alignment is maintained. Vertebral body heights are preserved. The dens is intact. Disc space narrowing and endplate spurring V2-Z3 and C6-C7. Multilevel facet arthropathy. Mild bony neural foraminal stenosis at C3-C4 on the right and C4-C5 on the left. There is no evidence of fracture. No prevertebral soft tissue edema. 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 No evidence acute abnormality. Electronically Signed   By: Jeb Levering M.D.   On: 12/12/2017 06:18   Dg Lumbar Spine Complete  Result Date: 12/12/2017 CLINICAL DATA:  Radicular pain in the right leg. New onset back pain 2 days ago. No known injury. EXAM: LUMBAR SPINE - COMPLETE 4+ VIEW COMPARISON:  Reformats from abdominal CT 11/05/2010 FINDINGS: T12 compression fracture. Lumbar vertebral body heights are preserved. Multilevel endplate spurring with preservation of disc spaces. Advanced facet arthropathy in the lower lumbar spine. No evidence of acute fracture. The bones are under mineralized. Sacroiliac joints are congruent with degenerative change. IMPRESSION: 1. T12 compression  fracture, chronic and present on prior abdominal CT, however difficult to completely assess given osteopenia and lung overlap on the lateral view. 2. Advanced facet arthropathy in the lower lumbar spine. Electronically Signed   By: Jeb Levering M.D.   On: 12/12/2017 06:16    Procedures Procedures (including critical care time)  Medications Ordered in ED Medications  methocarbamol (ROBAXIN) tablet 1,000 mg (1,000 mg Oral Given 12/12/17 0509)  HYDROmorphone (DILAUDID) injection 0.5 mg (0.5 mg Intravenous Given 12/12/17 0509)     Initial Impression / Assessment and Plan / ED Course  I have reviewed the triage vital signs and the nursing notes.  Pertinent labs & imaging results that were available during my care of the patient were reviewed by me and considered in my medical decision making (see chart for details).     Patient presents with neck pain times 2 days.  She is without high risk factors for epidural hematoma or abscess.  She has no clinical signs or symptoms of meningitis including no rash, fevers or nuchal rigidity.  No midline tenderness, step-off or deformity.  Patient denies falls or known trauma.  Plain films show old T12 compression fracture and multilevel degenerative disc disease and facet arthropathy throughout the cervical spine.  Patient without sensation or strength deficits in the upper or lower extremities.  Patient without improvement in her pain Dilaudid and Robaxin.  Will obtain MRI to assess for potential epidural hematoma or abscess.  6:55 AM At shift change care was transferred to [facility], PA-C who will follow pending studies, re-evaulate and determine disposition.    The patient was discussed with and seen by Dr. Randal Buba who agrees with the treatment plan.   Final Clinical Impressions(s) / ED Diagnoses   Final diagnoses:  Neck pain    ED Discharge Orders    None       Loni Muse Gwenlyn Perking 12/12/17 Newell, April,  MD 12/12/17 2505    Randal Buba, April, MD 12/12/17 0710

## 2017-12-25 DIAGNOSIS — Z6838 Body mass index (BMI) 38.0-38.9, adult: Secondary | ICD-10-CM | POA: Diagnosis not present

## 2017-12-25 DIAGNOSIS — I10 Essential (primary) hypertension: Secondary | ICD-10-CM | POA: Diagnosis not present

## 2017-12-25 DIAGNOSIS — M47892 Other spondylosis, cervical region: Secondary | ICD-10-CM | POA: Diagnosis not present

## 2018-02-18 DIAGNOSIS — G4733 Obstructive sleep apnea (adult) (pediatric): Secondary | ICD-10-CM | POA: Diagnosis not present

## 2018-05-09 IMAGING — CR DG LUMBAR SPINE COMPLETE 4+V
5 series · 5 of 5 positions shown · non-contrast
Comparison: Reformats from abdominal CT 11/05/2010

CLINICAL DATA: Radicular pain in the right leg. New onset back pain
2 days ago. No known injury.

EXAM:
LUMBAR SPINE - COMPLETE 4+ VIEW

[t lumbar spine ap]
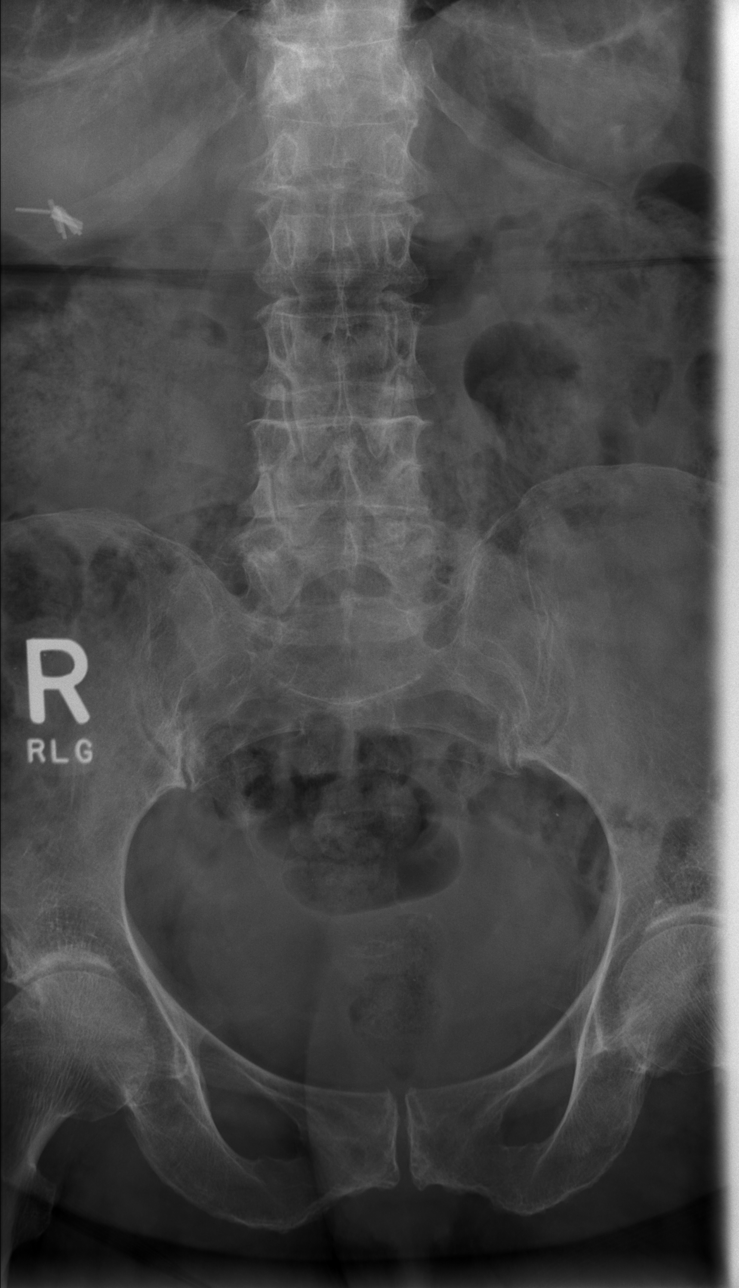

[t lumbar spine obl (1 of 2)]
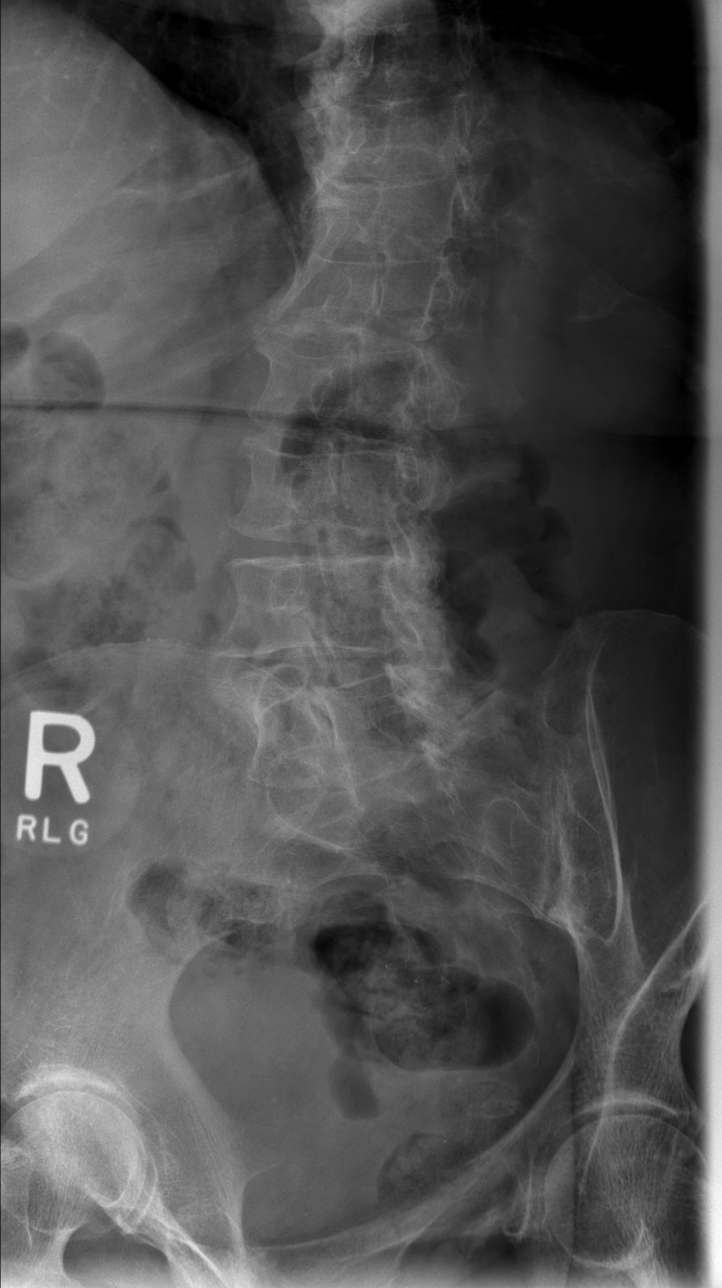

[t lumbar spine obl (2 of 2)]
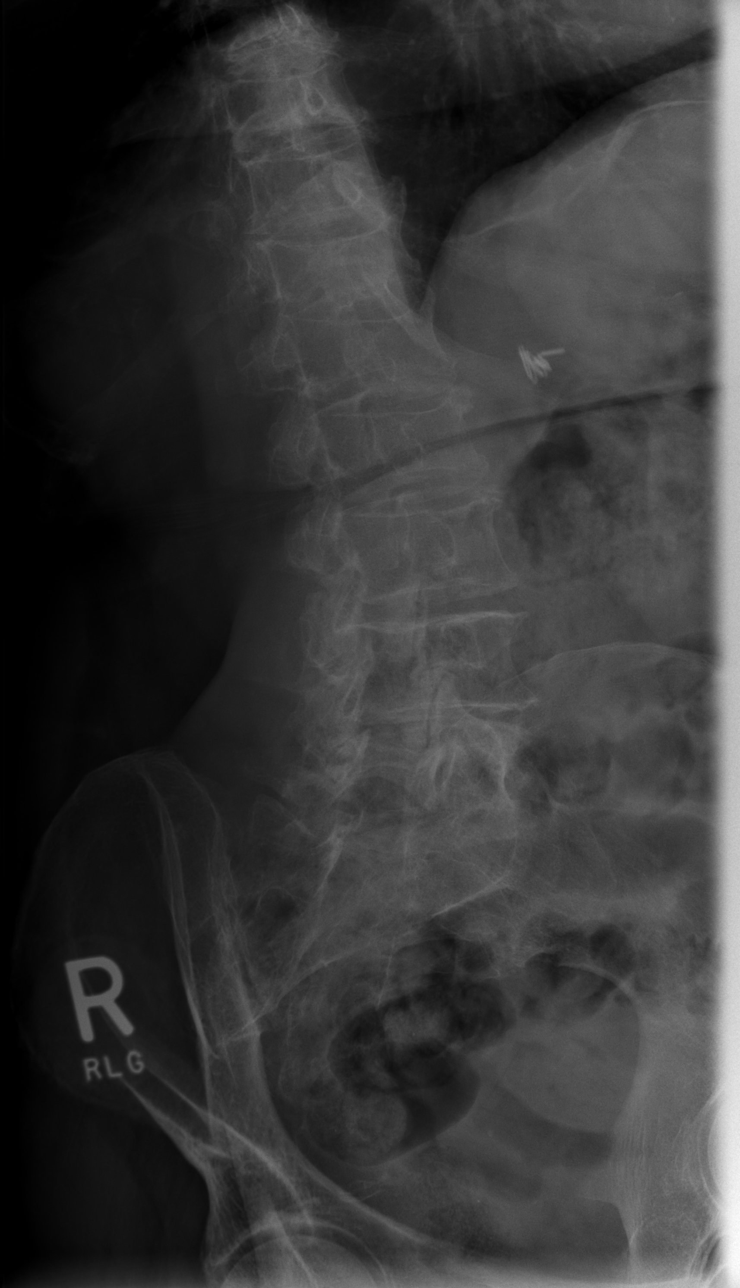

[t lumbar spine lat]
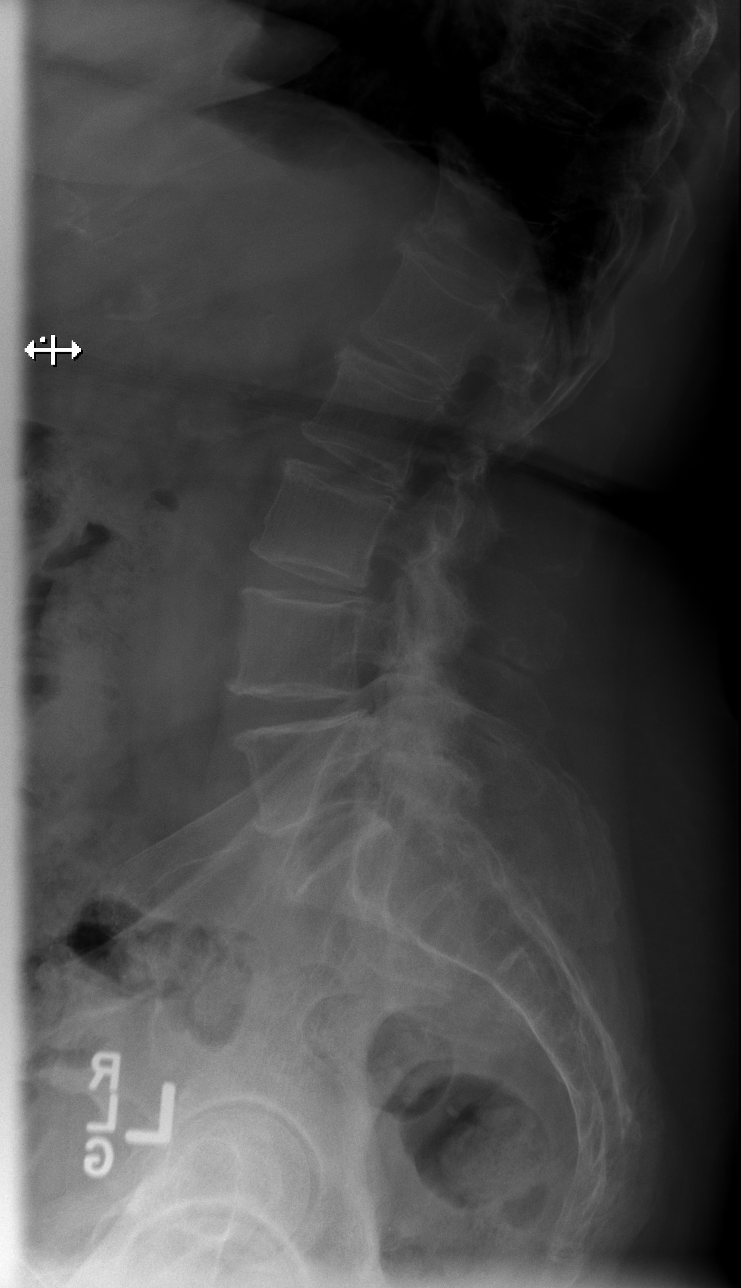

[t lumbar l-5 s-1 spot]
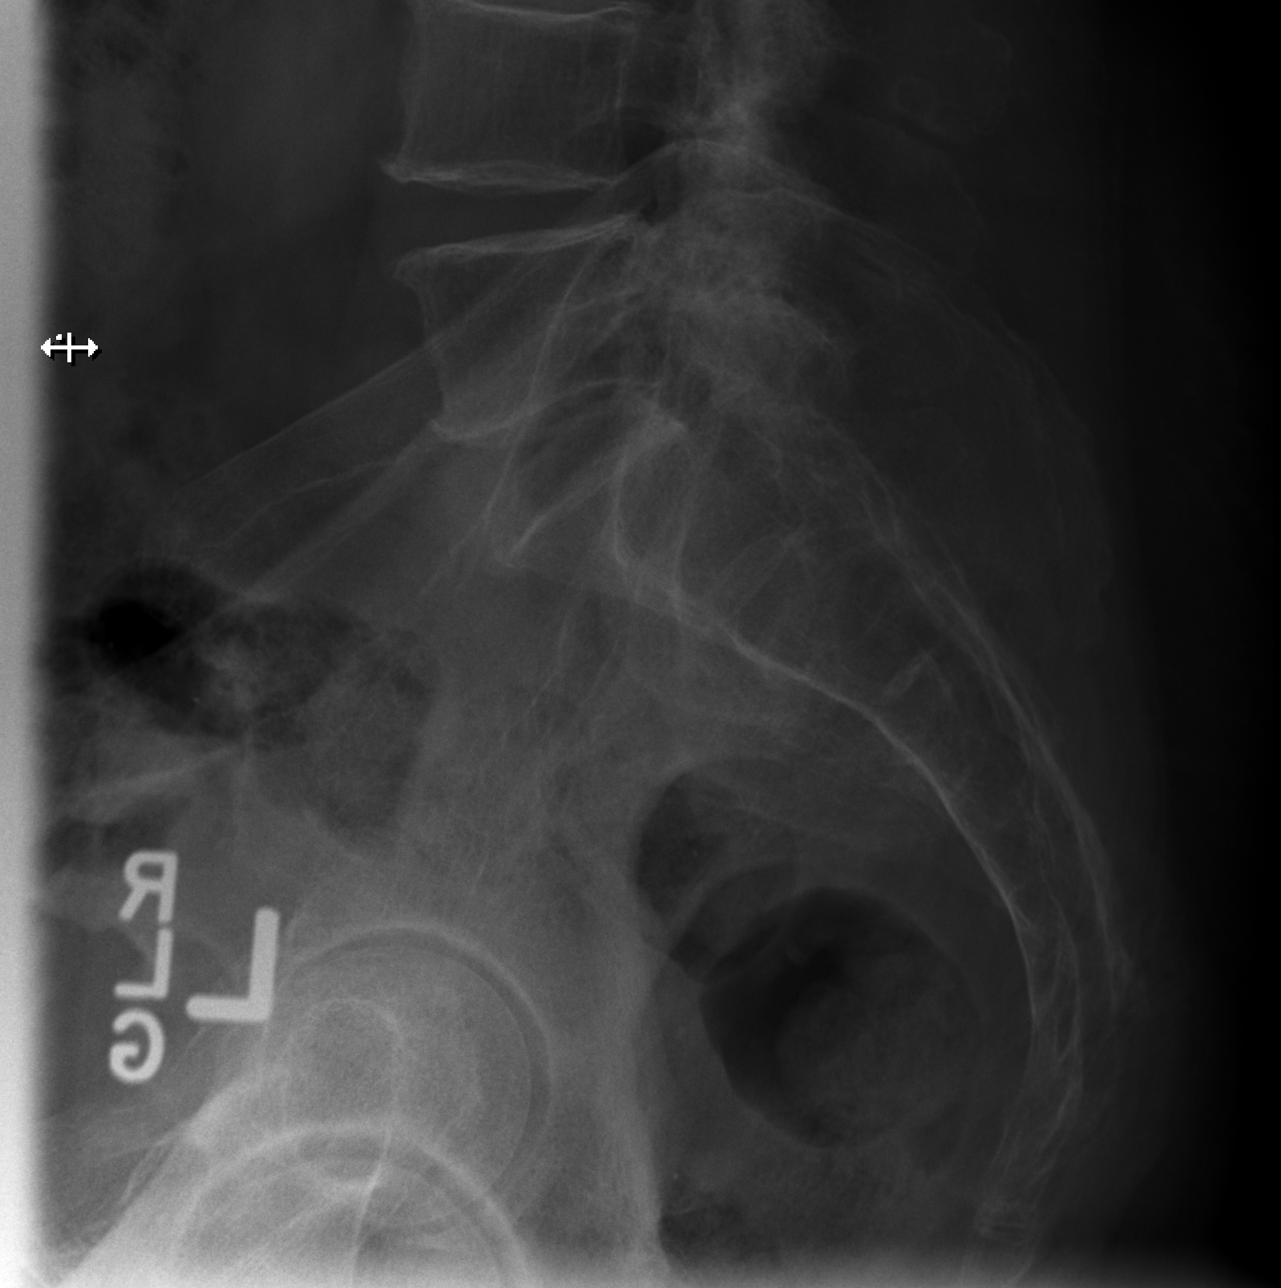

[5 of 5 positions shown; findings below may reference images not displayed]

FINDINGS: T12 compression fracture. Lumbar vertebral body heights are
preserved. Multilevel endplate spurring with preservation of disc
spaces. Advanced facet arthropathy in the lower lumbar spine. No
evidence of acute fracture. The bones are under mineralized.
Sacroiliac joints are congruent with degenerative change.
IMPRESSION: 1. T12 compression fracture, chronic and present on prior abdominal
CT, however difficult to completely assess given osteopenia and lung
overlap on the lateral view.
2. Advanced facet arthropathy in the lower lumbar spine.

## 2018-05-09 IMAGING — CR DG CERVICAL SPINE COMPLETE 4+V
6 series · 6 of 6 positions shown · non-contrast
Comparison: None.

CLINICAL DATA: Torticollis.  Posterior neck pain for 2 days.

EXAM:
CERVICAL SPINE - COMPLETE 4+ VIEW

[w cervical spine lat]
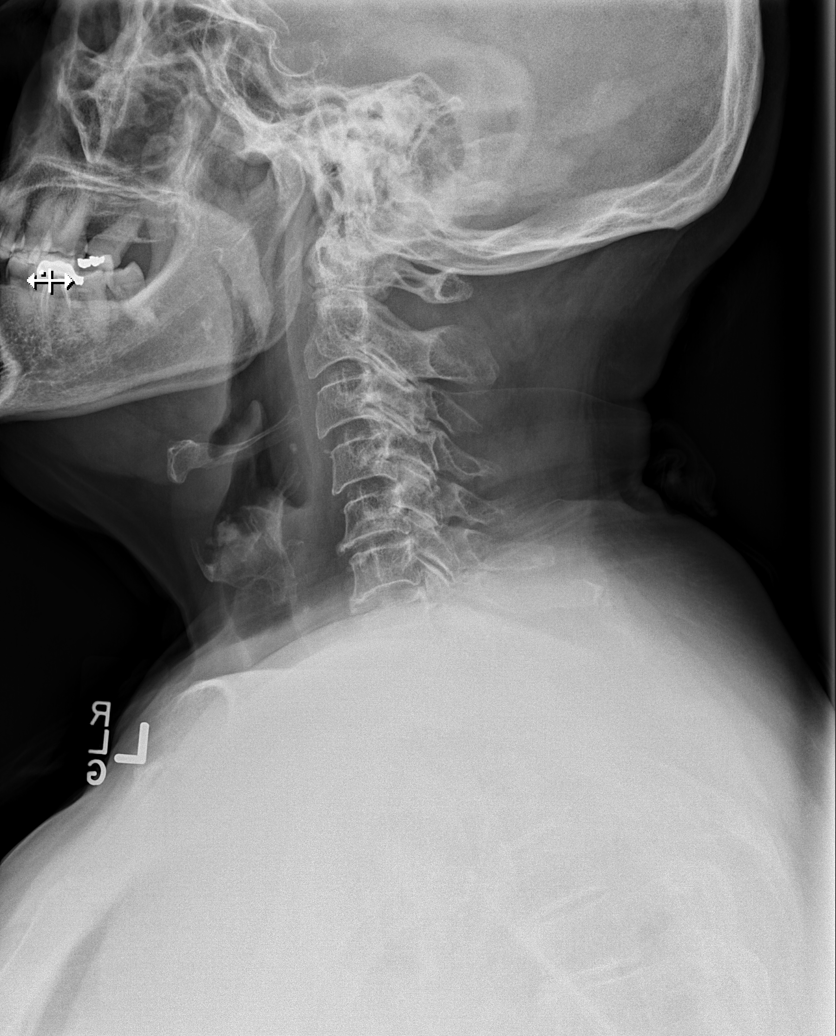

[w cervical spine ap_obl (1 of 2)]
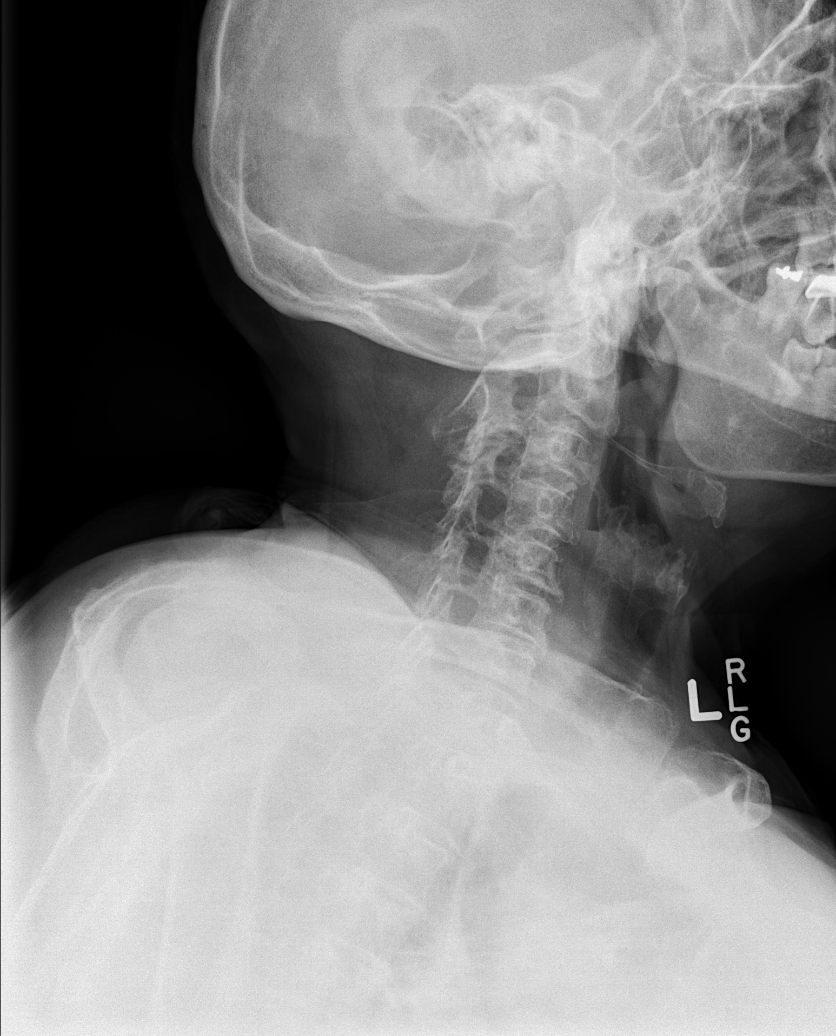

[w cervical spine ap_obl (2 of 2)]
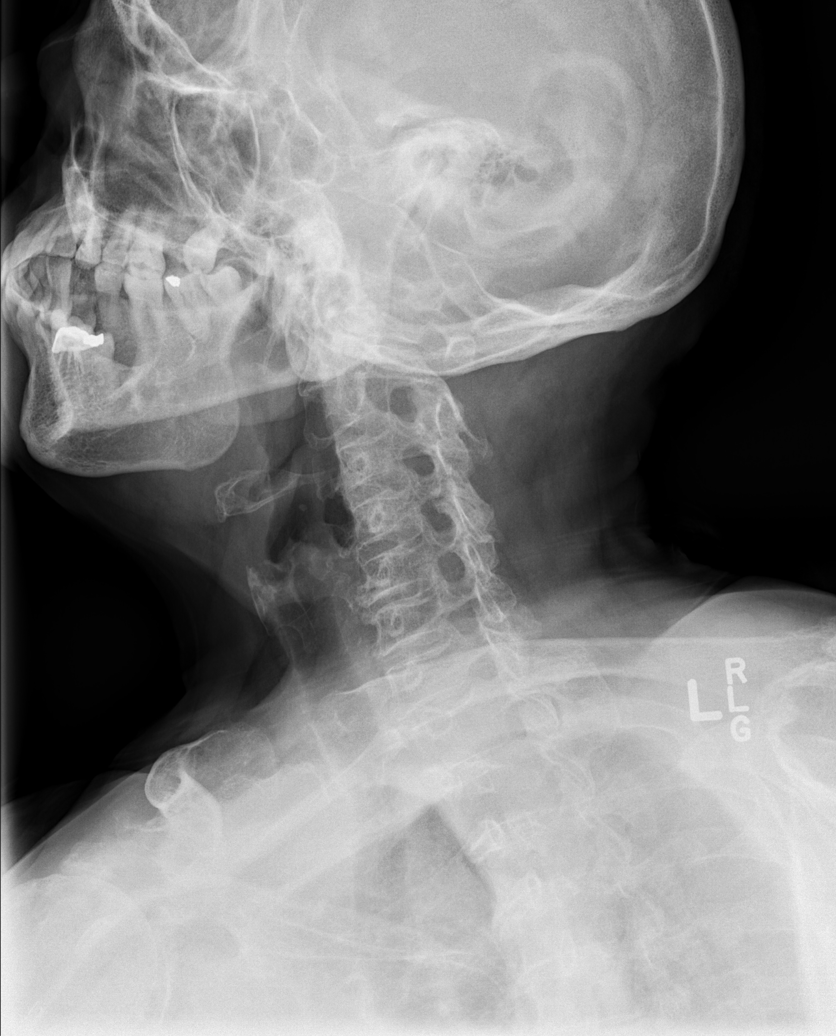

[w cervical spine ap]
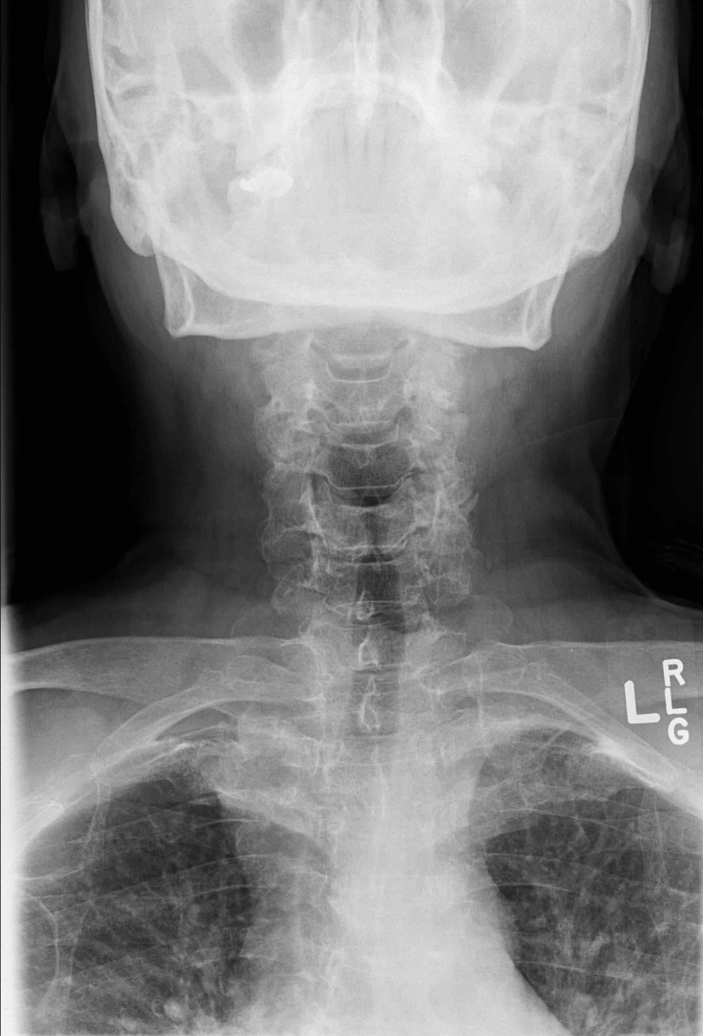

[w cervical spine odontoid]
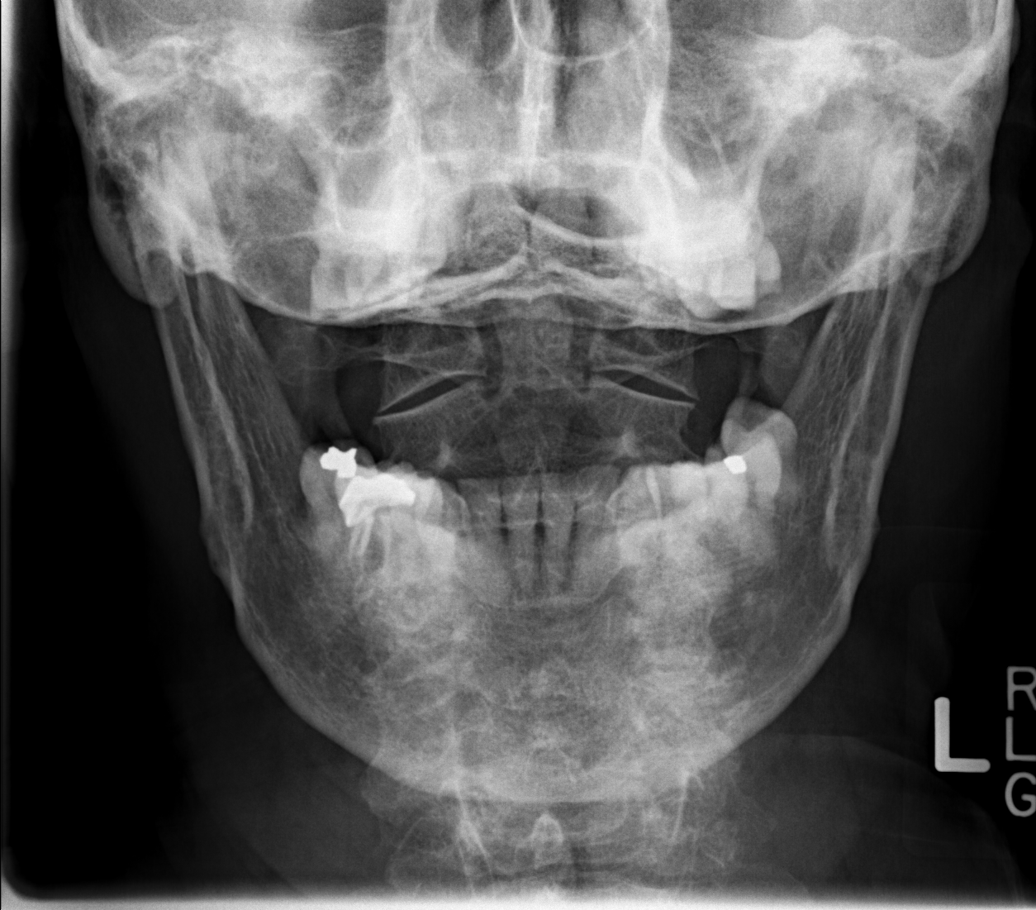

[w cervical swimmers]
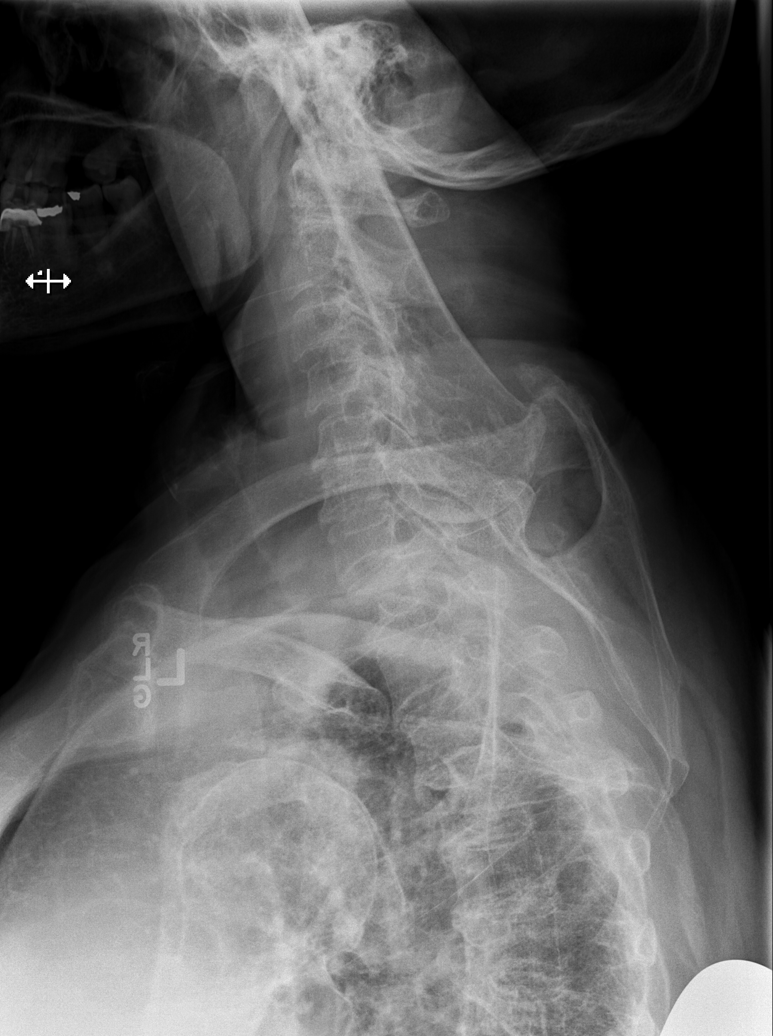

[6 of 6 positions shown; findings below may reference images not displayed]

FINDINGS: Cervical spine alignment is maintained. Vertebral body heights are
preserved. The dens is intact. Disc space narrowing and endplate
spurring C5-C6 and C6-C7. Multilevel facet arthropathy. Mild bony
neural foraminal stenosis at C3-C4 on the right and C4-C5 on the
left. There is no evidence of fracture. No prevertebral soft tissue
edema.
IMPRESSION: Multilevel degenerative disc disease and facet arthropathy
throughout the cervical spine. No evidence acute abnormality.

## 2018-05-22 DIAGNOSIS — Z6838 Body mass index (BMI) 38.0-38.9, adult: Secondary | ICD-10-CM | POA: Diagnosis not present

## 2018-05-22 DIAGNOSIS — E669 Obesity, unspecified: Secondary | ICD-10-CM | POA: Diagnosis not present

## 2018-05-22 DIAGNOSIS — M810 Age-related osteoporosis without current pathological fracture: Secondary | ICD-10-CM | POA: Diagnosis not present

## 2018-05-22 DIAGNOSIS — E782 Mixed hyperlipidemia: Secondary | ICD-10-CM | POA: Diagnosis not present

## 2018-05-22 DIAGNOSIS — G4730 Sleep apnea, unspecified: Secondary | ICD-10-CM | POA: Diagnosis not present

## 2018-05-22 DIAGNOSIS — R7301 Impaired fasting glucose: Secondary | ICD-10-CM | POA: Diagnosis not present

## 2018-05-22 DIAGNOSIS — M1990 Unspecified osteoarthritis, unspecified site: Secondary | ICD-10-CM | POA: Diagnosis not present

## 2018-05-22 DIAGNOSIS — R899 Unspecified abnormal finding in specimens from other organs, systems and tissues: Secondary | ICD-10-CM | POA: Diagnosis not present

## 2018-05-22 DIAGNOSIS — R8290 Unspecified abnormal findings in urine: Secondary | ICD-10-CM | POA: Diagnosis not present

## 2018-05-22 DIAGNOSIS — Z0000 Encounter for general adult medical examination without abnormal findings: Secondary | ICD-10-CM | POA: Diagnosis not present

## 2018-05-22 DIAGNOSIS — I10 Essential (primary) hypertension: Secondary | ICD-10-CM | POA: Diagnosis not present

## 2018-05-22 DIAGNOSIS — K760 Fatty (change of) liver, not elsewhere classified: Secondary | ICD-10-CM | POA: Diagnosis not present

## 2018-06-18 DIAGNOSIS — H524 Presbyopia: Secondary | ICD-10-CM | POA: Diagnosis not present

## 2018-06-18 DIAGNOSIS — H52223 Regular astigmatism, bilateral: Secondary | ICD-10-CM | POA: Diagnosis not present

## 2018-06-18 DIAGNOSIS — H25813 Combined forms of age-related cataract, bilateral: Secondary | ICD-10-CM | POA: Diagnosis not present

## 2018-06-18 DIAGNOSIS — H5203 Hypermetropia, bilateral: Secondary | ICD-10-CM | POA: Diagnosis not present

## 2018-06-18 DIAGNOSIS — H04123 Dry eye syndrome of bilateral lacrimal glands: Secondary | ICD-10-CM | POA: Diagnosis not present

## 2018-06-22 DIAGNOSIS — M1712 Unilateral primary osteoarthritis, left knee: Secondary | ICD-10-CM | POA: Diagnosis not present

## 2018-07-14 DIAGNOSIS — H2513 Age-related nuclear cataract, bilateral: Secondary | ICD-10-CM | POA: Diagnosis not present

## 2018-07-14 DIAGNOSIS — H2512 Age-related nuclear cataract, left eye: Secondary | ICD-10-CM | POA: Diagnosis not present

## 2018-07-17 DIAGNOSIS — M1712 Unilateral primary osteoarthritis, left knee: Secondary | ICD-10-CM | POA: Diagnosis not present

## 2018-07-23 DIAGNOSIS — M1712 Unilateral primary osteoarthritis, left knee: Secondary | ICD-10-CM | POA: Diagnosis not present

## 2018-07-27 DIAGNOSIS — H25812 Combined forms of age-related cataract, left eye: Secondary | ICD-10-CM | POA: Diagnosis not present

## 2018-07-27 DIAGNOSIS — H2512 Age-related nuclear cataract, left eye: Secondary | ICD-10-CM | POA: Diagnosis not present

## 2018-07-31 DIAGNOSIS — M1712 Unilateral primary osteoarthritis, left knee: Secondary | ICD-10-CM | POA: Diagnosis not present

## 2018-08-04 DIAGNOSIS — H2511 Age-related nuclear cataract, right eye: Secondary | ICD-10-CM | POA: Diagnosis not present

## 2018-08-17 DIAGNOSIS — H2511 Age-related nuclear cataract, right eye: Secondary | ICD-10-CM | POA: Diagnosis not present

## 2018-08-17 DIAGNOSIS — H25811 Combined forms of age-related cataract, right eye: Secondary | ICD-10-CM | POA: Diagnosis not present

## 2018-09-02 DIAGNOSIS — Z1231 Encounter for screening mammogram for malignant neoplasm of breast: Secondary | ICD-10-CM | POA: Diagnosis not present

## 2018-09-25 DIAGNOSIS — M1712 Unilateral primary osteoarthritis, left knee: Secondary | ICD-10-CM | POA: Diagnosis not present

## 2018-10-23 DIAGNOSIS — M25562 Pain in left knee: Secondary | ICD-10-CM | POA: Diagnosis not present

## 2018-10-23 DIAGNOSIS — R7301 Impaired fasting glucose: Secondary | ICD-10-CM | POA: Diagnosis not present

## 2018-10-23 DIAGNOSIS — Z6838 Body mass index (BMI) 38.0-38.9, adult: Secondary | ICD-10-CM | POA: Diagnosis not present

## 2018-10-23 DIAGNOSIS — E039 Hypothyroidism, unspecified: Secondary | ICD-10-CM | POA: Diagnosis not present

## 2018-10-23 DIAGNOSIS — I10 Essential (primary) hypertension: Secondary | ICD-10-CM | POA: Diagnosis not present

## 2018-10-23 DIAGNOSIS — G4730 Sleep apnea, unspecified: Secondary | ICD-10-CM | POA: Diagnosis not present

## 2019-01-05 DIAGNOSIS — G4733 Obstructive sleep apnea (adult) (pediatric): Secondary | ICD-10-CM | POA: Diagnosis not present

## 2019-01-05 DIAGNOSIS — M1712 Unilateral primary osteoarthritis, left knee: Secondary | ICD-10-CM | POA: Diagnosis not present

## 2019-01-05 NOTE — H&P (Signed)
TOTAL KNEE ADMISSION H&P  Patient is being admitted for left total knee arthroplasty.  Subjective:  Chief Complaint:left knee pain.  HPI: Mary Hayes, 78 y.o. female, has a history of pain and functional disability in the left knee due to arthritis and has failed non-surgical conservative treatments for greater than 12 weeks to includecorticosteriod injections, viscosupplementation injections and activity modification.  Onset of symptoms was gradual, starting >10 years ago with gradually worsening course since that time. The patient noted prior procedures on the knee to include  arthroscopy on the left knee(s).  Patient currently rates pain in the left knee(s) at 9 out of 10 with activity. Patient has worsening of pain with activity and weight bearing, crepitus and joint swelling.  Patient has evidence of tri-compartmental bone-on-bone arthritis with large osteophyte formation by imaging studies. There is no active infection.  There are no active problems to display for this patient.  Past Medical History:  Diagnosis Date  . Arthritis   . Hypercholesteremia   . Hypertension     Past Surgical History:  Procedure Laterality Date  . JOINT REPLACEMENT Right     No current facility-administered medications for this encounter.    Current Outpatient Medications  Medication Sig Dispense Refill Last Dose  . acetaminophen (TYLENOL) 650 MG CR tablet Take 1,300 mg by mouth every 8 (eight) hours as needed for pain.   Past Month at Unknown time  . atorvastatin (LIPITOR) 10 MG tablet Take 10 mg by mouth daily.   12/11/2017 at Unknown time  . Capsaicin (CAPZASIN-HP) 0.1 % CREA Apply 1 application topically 3 (three) times daily as needed. Pain   12/11/2017 at Unknown time  . hydrocortisone cream 1 % Apply 1 application topically 3 (three) times daily as needed for itching.   Past Month at Unknown time  . losartan (COZAAR) 50 MG tablet Take 50 mg by mouth daily.   12/11/2017 at Unknown time  .  methocarbamol (ROBAXIN) 500 MG tablet Take 1 tablet (500 mg total) by mouth 2 (two) times daily. 20 tablet 0   . methylPREDNISolone (MEDROL DOSEPAK) 4 MG TBPK tablet Day 1: 8 mg PO before breakfast, 4 mg after lunch and after dinner, and 8 mg at bedtime  Day 2: 4 mg PO before breakfast, after lunch, and after dinner and 8 mg at bedtime  Day 3: 4 mg PO before breakfast, after lunch, after dinner, and at bedtime  Day 4: 4 mg PO before breakfast, after lunch, and at bedtime  Day 5: 4 mg PO before breakfast and at bedtime  Day 6: 4 mg PO before breakfast 21 tablet 0   . Multiple Vitamin (MULTIVITAMIN WITH MINERALS) TABS tablet Take 1 tablet by mouth daily.   12/11/2017 at Unknown time  . naproxen (NAPROSYN) 375 MG tablet Take 1 tablet (375 mg total) by mouth 2 (two) times daily. 20 tablet 0   . psyllium (HYDROCIL/METAMUCIL) 95 % PACK Take 1 packet by mouth daily.   12/11/2017 at Unknown time  . tiZANidine (ZANAFLEX) 4 MG tablet Take 4 mg by mouth every 6 (six) hours as needed for muscle spasms.   12/11/2017 at Unknown time  . traMADol (ULTRAM) 50 MG tablet Take 50 mg by mouth every 6 (six) hours as needed for severe pain.   12/11/2017 at Unknown time   No Known Allergies  Social History   Tobacco Use  . Smoking status: Never Smoker  . Smokeless tobacco: Never Used  Substance Use Topics  . Alcohol use:  No    No family history on file.   Review of Systems  Constitutional: Negative for chills and fever.  HENT: Negative for congestion, sore throat and tinnitus.   Eyes: Negative for double vision, photophobia and pain.  Respiratory: Negative for cough, shortness of breath and wheezing.   Cardiovascular: Negative for chest pain, palpitations and orthopnea.  Gastrointestinal: Negative for heartburn, nausea and vomiting.  Genitourinary: Negative for dysuria, frequency and urgency.  Musculoskeletal: Positive for joint pain.  Neurological: Negative for dizziness, weakness and headaches.     Objective:  Physical Exam  Well nourished and well developed.  General: Alert and oriented x3, cooperative and pleasant, no acute distress.  Head: normocephalic, atraumatic, neck supple.  Eyes: EOMI.  Respiratory: breath sounds clear in all fields, no wheezing, rales, or rhonchi. Cardiovascular: Regular rate and rhythm, no murmurs, gallops or rubs.  Abdomen: non-tender to palpation and soft, normoactive bowel sounds. Musculoskeletal: Left Knee Exam: Moderate effusion. Varus deformity. Range of motion is 15-110 degrees. No crepitus on range of motion of the knee. Positive medial joint line tenderness. Positive lateral joint line tenderness. Stable knee. Calves soft and nontender. Motor function intact in LE. Strength 5/5 LE bilaterally. Neuro: Distal pulses 2+. Sensation to light touch intact in LE.  Vital signs in last 24 hours: Blood pressure: 154/78 mmHg Pulse: 72 bpm  Labs:   Estimated body mass index is 31.47 kg/m as calculated from the following:   Height as of 12/12/17: 5\' 6"  (1.676 m).   Weight as of 12/12/17: 88.5 kg.   Imaging Review Plain radiographs demonstrate severe degenerative joint disease of the left knee(s). The overall alignment ismild varus. The bone quality appears to be adequate for age and reported activity level.   Preoperative templating of the joint replacement has been completed, documented, and submitted to the Operating Room personnel in order to optimize intra-operative equipment management.   Anticipated LOS equal to or greater than 2 midnights due to - Age 20 and older with one or more of the following:  - Obesity  - Expected need for hospital services (PT, OT, Nursing) required for safe  discharge  - Anticipated need for postoperative skilled nursing care or inpatient rehab  - Active co-morbidities: None OR   - Unanticipated findings during/Post Surgery: None  - Patient is a high risk of re-admission due to:  None     Assessment/Plan:  End stage arthritis, left knee   The patient history, physical examination, clinical judgment of the provider and imaging studies are consistent with end stage degenerative joint disease of the left knee(s) and total knee arthroplasty is deemed medically necessary. The treatment options including medical management, injection therapy arthroscopy and arthroplasty were discussed at length. The risks and benefits of total knee arthroplasty were presented and reviewed. The risks due to aseptic loosening, infection, stiffness, patella tracking problems, thromboembolic complications and other imponderables were discussed. The patient acknowledged the explanation, agreed to proceed with the plan and consent was signed. Patient is being admitted for inpatient treatment for surgery, pain control, PT, OT, prophylactic antibiotics, VTE prophylaxis, progressive ambulation and ADL's and discharge planning. The patient is planning to be discharged home.   Therapy Plans: HHPT versus outpatient therapy Disposition: Home with son Planned DVT Prophylaxis: Aspirin 325 mg BID DME needed: Gilford Rile, 3-n-1 PCP: Hulan Fess, MD TXA: IV Allergies: Lisinopril Anesthesia Concerns: Sleep apnea Last HgbA1c: 6.3%  - Patient was instructed on what medications to stop prior to surgery. - Follow-up visit in 2  weeks with Dr. Wynelle Link - Begin physical therapy following surgery - Pre-operative lab work as pre-surgical testing - Prescriptions will be provided in hospital at time of discharge  Theresa Duty, PA-C Orthopedic Surgery [facility] [place]

## 2019-01-05 NOTE — Progress Notes (Signed)
ekg 10-23-18 on chart   lov pcp and labe dr. Hulan Fess 10-23-18 on chart  Clearance Lennette Bihari little 10-23-18 on chart

## 2019-01-05 NOTE — Patient Instructions (Addendum)
Mary Hayes  01/05/2019   Your procedure is scheduled on: 01-11-19   Report to [facility] Main  Entrance              Report to admitting at      1120 AM    Call this number if you have problems the morning of surgery [phone]   Remember: Do not eat food  :After Midnight. You may have clear liquids until 0750 am then nothing by mouth    CLEAR LIQUID DIET   Foods Allowed                                                                     Foods Excluded  Coffee and tea, regular and decaf                             liquids that you cannot  Plain Jell-O in any flavor                                             see through such as: Fruit ices (not with fruit pulp)                                     milk, soups, orange juice  Iced Popsicles                                    All solid food Carbonated beverages, regular and diet                                    Cranberry, grape and apple juices Sports drinks like Gatorade Lightly seasoned clear broth or consume(fat free) Sugar, honey syrup  ___________________________________________________________________    BRUSH YOUR TEETH MORNING OF SURGERY AND RINSE YOUR MOUTH OUT, NO CHEWING GUM CANDY OR MINTS.     Take these medicines the morning of surgery with A SIP OF WATER: lipitor and tylenol if needed, levothyroxine, raloxifene ( Evista)                                 You may not have any metal on your body including hair pins and              piercings  Do not wear jewelry, make-up, lotions, powders or perfumes, deodorant             Do not wear nail polish.  Do not shave  48 hours prior to surgery.     Do not bring valuables to the hospital. [place].  Contacts, dentures or bridgework may not  be worn into surgery.  Leave suitcase in the car. After surgery it may be brought to your room.   Special Instructions: N/A              Please  read over the following fact sheets you were given: _____________________________________________________________________         [facility] - Preparing for Surgery Before surgery, you can play an important role.  Because skin is not sterile, your skin needs to be as free of germs as possible.  You can reduce the number of germs on your skin by washing with CHG (chlorahexidine gluconate) soap before surgery.  CHG is an antiseptic cleaner which kills germs and bonds with the skin to continue killing germs even after washing. Please DO NOT use if you have an allergy to CHG or antibacterial soaps.  If your skin becomes reddened/irritated stop using the CHG and inform your nurse when you arrive at Short Stay. Do not shave (including legs and underarms) for at least 48 hours prior to the first CHG shower.  You may shave your face/neck. Please follow these instructions carefully:  1.  Shower with CHG Soap the night before surgery and the  morning of Surgery.  2.  If you choose to wash your hair, wash your hair first as usual with your  normal  shampoo.  3.  After you shampoo, rinse your hair and body thoroughly to remove the  shampoo.                           4.  Use CHG as you would any other liquid soap.  You can apply chg directly  to the skin and wash                       Gently with a scrungie or clean washcloth.  5.  Apply the CHG Soap to your body ONLY FROM THE NECK DOWN.   Do not use on face/ open                           Wound or open sores. Avoid contact with eyes, ears mouth and genitals (private parts).                       Wash face,  Genitals (private parts) with your normal soap.             6.  Wash thoroughly, paying special attention to the area where your surgery  will be performed.  7.  Thoroughly rinse your body with warm water from the neck down.  8.  DO NOT shower/wash with your normal soap after using and rinsing off  the CHG Soap.                9.  Pat yourself dry with a  clean towel.            10.  Wear clean pajamas.            11.  Place clean sheets on your bed the night of your first shower and do not  sleep with pets. Day of Surgery : Do not apply any lotions/deodorants the morning of surgery.  Please wear clean clothes to the hospital/surgery center.  FAILURE TO FOLLOW THESE INSTRUCTIONS MAY RESULT IN THE CANCELLATION OF YOUR SURGERY PATIENT SIGNATURE_________________________________  NURSE SIGNATURE__________________________________  ________________________________________________________________________  WHAT IS A BLOOD TRANSFUSION? Blood Transfusion Information  A transfusion is the replacement of blood or some of its parts. Blood is made up of multiple cells which provide different functions.  Red blood cells carry oxygen and are used for blood loss replacement.  White blood cells fight against infection.  Platelets control bleeding.  Plasma helps clot blood.  Other blood products are available for specialized needs, such as hemophilia or other clotting disorders. BEFORE THE TRANSFUSION  Who gives blood for transfusions?   Healthy volunteers who are fully evaluated to make sure their blood is safe. This is blood bank blood. Transfusion therapy is the safest it has ever been in the practice of medicine. Before blood is taken from a donor, a complete history is taken to make sure that person has no history of diseases nor engages in risky social behavior (examples are intravenous drug use or sexual activity with multiple partners). The donor's travel history is screened to minimize risk of transmitting infections, such as malaria. The donated blood is tested for signs of infectious diseases, such as HIV and hepatitis. The blood is then tested to be sure it is compatible with you in order to minimize the chance of a transfusion reaction. If you or a relative donates blood, this is often done in anticipation of surgery and is not appropriate for  emergency situations. It takes many days to process the donated blood. RISKS AND COMPLICATIONS Although transfusion therapy is very safe and saves many lives, the main dangers of transfusion include:   Getting an infectious disease.  Developing a transfusion reaction. This is an allergic reaction to something in the blood you were given. Every precaution is taken to prevent this. The decision to have a blood transfusion has been considered carefully by your caregiver before blood is given. Blood is not given unless the benefits outweigh the risks. AFTER THE TRANSFUSION  Right after receiving a blood transfusion, you will usually feel much better and more energetic. This is especially true if your red blood cells have gotten low (anemic). The transfusion raises the level of the red blood cells which carry oxygen, and this usually causes an energy increase.  The nurse administering the transfusion will monitor you carefully for complications. HOME CARE INSTRUCTIONS  No special instructions are needed after a transfusion. You may find your energy is better. Speak with your caregiver about any limitations on activity for underlying diseases you may have. SEEK MEDICAL CARE IF:   Your condition is not improving after your transfusion.  You develop redness or irritation at the intravenous (IV) site. SEEK IMMEDIATE MEDICAL CARE IF:  Any of the following symptoms occur over the next 12 hours:  Shaking chills.  You have a temperature by mouth above 102 F (38.9 C), not controlled by medicine.  Chest, back, or muscle pain.  People around you feel you are not acting correctly or are confused.  Shortness of breath or difficulty breathing.  Dizziness and fainting.  You get a rash or develop hives.  You have a decrease in urine output.  Your urine turns a dark color or changes to pink, red, or brown. Any of the following symptoms occur over the next 10 days:  You have a temperature by  mouth above 102 F (38.9 C), not controlled by medicine.  Shortness of breath.  Weakness after normal activity.  The white part of the eye turns yellow (jaundice).  You have a decrease in the amount of urine or are  urinating less often.  Your urine turns a dark color or changes to pink, red, or brown. Document Released: 12/06/2000 Document Revised: 03/02/2012 Document Reviewed: 07/25/2008 ExitCare Patient Information 2014 [place].  _______________________________________________________________________  Incentive Spirometer  An incentive spirometer is a tool that can help keep your lungs clear and active. This tool measures how well you are filling your lungs with each breath. Taking long deep breaths may help reverse or decrease the chance of developing breathing (pulmonary) problems (especially infection) following:  A long period of time when you are unable to move or be active. BEFORE THE PROCEDURE   If the spirometer includes an indicator to show your best effort, your nurse or respiratory therapist will set it to a desired goal.  If possible, sit up straight or lean slightly forward. Try not to slouch.  Hold the incentive spirometer in an upright position. INSTRUCTIONS FOR USE  1. Sit on the edge of your bed if possible, or sit up as far as you can in bed or on a chair. 2. Hold the incentive spirometer in an upright position. 3. Breathe out normally. 4. Place the mouthpiece in your mouth and seal your lips tightly around it. 5. Breathe in slowly and as deeply as possible, raising the piston or the ball toward the top of the column. 6. Hold your breath for 3-5 seconds or for as long as possible. Allow the piston or ball to fall to the bottom of the column. 7. Remove the mouthpiece from your mouth and breathe out normally. 8. Rest for a few seconds and repeat Steps 1 through 7 at least 10 times every 1-2 hours when you are awake. Take your time and take a few normal  breaths between deep breaths. 9. The spirometer may include an indicator to show your best effort. Use the indicator as a goal to work toward during each repetition. 10. After each set of 10 deep breaths, practice coughing to be sure your lungs are clear. If you have an incision (the cut made at the time of surgery), support your incision when coughing by placing a pillow or rolled up towels firmly against it. Once you are able to get out of bed, walk around indoors and cough well. You may stop using the incentive spirometer when instructed by your caregiver.  RISKS AND COMPLICATIONS  Take your time so you do not get dizzy or light-headed.  If you are in pain, you may need to take or ask for pain medication before doing incentive spirometry. It is harder to take a deep breath if you are having pain. AFTER USE  Rest and breathe slowly and easily.  It can be helpful to keep track of a log of your progress. Your caregiver can provide you with a simple table to help with this. If you are using the spirometer at home, follow these instructions: [place] IF:   You are having difficultly using the spirometer.  You have trouble using the spirometer as often as instructed.  Your pain medication is not giving enough relief while using the spirometer.  You develop fever of 100.5 F (38.1 C) or higher. SEEK IMMEDIATE MEDICAL CARE IF:   You cough up bloody sputum that had not been present before.  You develop fever of 102 F (38.9 C) or greater.  You develop worsening pain at or near the incision site. MAKE SURE YOU:   Understand these instructions.  Will watch your condition.  Will get help right away  if you are not doing well or get worse. Document Released: 04/21/2007 Document Revised: 03/02/2012 Document Reviewed: 06/22/2007 [facility] Patient Information 2014 [place], [place].   ________________________________________________________________________

## 2019-01-06 ENCOUNTER — Other Ambulatory Visit: Payer: Self-pay

## 2019-01-06 ENCOUNTER — Encounter (HOSPITAL_COMMUNITY)
Admission: RE | Admit: 2019-01-06 | Discharge: 2019-01-06 | Disposition: A | Discharge status: Home / Self Care | Payer: PPO | Source: Ambulatory Visit | Attending: Orthopedic Surgery | Admitting: Orthopedic Surgery

## 2019-01-06 ENCOUNTER — Encounter (HOSPITAL_COMMUNITY): Payer: Self-pay

## 2019-01-06 DIAGNOSIS — Z01812 Encounter for preprocedural laboratory examination: Secondary | ICD-10-CM | POA: Diagnosis not present

## 2019-01-06 HISTORY — DX: Hypothyroidism, unspecified: E03.9

## 2019-01-06 HISTORY — DX: Sleep apnea, unspecified: G47.30

## 2019-01-06 LAB — APTT: aPTT: 28 seconds (ref 24–36)

## 2019-01-06 LAB — COMPREHENSIVE METABOLIC PANEL
ALT: 31 U/L (ref 0–44)
AST: 34 U/L (ref 15–41)
Albumin: 3.9 g/dL (ref 3.5–5.0)
Alkaline Phosphatase: 32 U/L — ABNORMAL LOW (ref 38–126)
Anion gap: 9 (ref 5–15)
BUN: 17 mg/dL (ref 8–23)
CALCIUM: 8.6 mg/dL — AB (ref 8.9–10.3)
CO2: 23 mmol/L (ref 22–32)
Chloride: 107 mmol/L (ref 98–111)
Creatinine, Ser: 0.71 mg/dL (ref 0.44–1.00)
GFR calc Af Amer: >60 mL/min (ref >60)
GFR calc non Af Amer: >60 mL/min (ref >60)
Glucose, Bld: 115 mg/dL — ABNORMAL HIGH (ref 70–99)
Potassium: 4.4 mmol/L (ref 3.5–5.1)
Sodium: 139 mmol/L (ref 135–145)
Total Bilirubin: 0.6 mg/dL (ref 0.3–1.2)
Total Protein: 6.7 g/dL (ref 6.5–8.1)

## 2019-01-06 LAB — CBC
HCT: 39.3 % (ref 36.0–46.0)
HEMOGLOBIN: 12.6 g/dL (ref 12.0–15.0)
MCH: 29 pg (ref 26.0–34.0)
MCHC: 32.1 g/dL (ref 30.0–36.0)
MCV: 90.6 fL (ref 80.0–100.0)
Platelets: 233 10*3/uL (ref 150–400)
RBC: 4.34 MIL/uL (ref 3.87–5.11)
RDW: 13.2 % (ref 11.5–15.5)
WBC: 7.5 10*3/uL (ref 4.0–10.5)
nRBC: 0 % (ref 0.0–0.2)

## 2019-01-06 LAB — SURGICAL PCR SCREEN
MRSA, PCR: NEGATIVE
STAPHYLOCOCCUS AUREUS: NEGATIVE

## 2019-01-06 LAB — PROTIME-INR
INR: 1.01
Prothrombin Time: 13.3 seconds (ref 11.4–15.2)

## 2019-01-10 MED ORDER — BUPIVACAINE LIPOSOME 1.3 % IJ SUSP
20.0000 mL | Freq: Once | INTRAMUSCULAR | Status: DC
  Filled 2019-01-10 (×2): qty 20

## 2019-01-11 ENCOUNTER — Encounter (HOSPITAL_COMMUNITY): Payer: Self-pay | Admitting: *Deleted

## 2019-01-11 ENCOUNTER — Inpatient Hospital Stay (HOSPITAL_COMMUNITY)
Admission: RE | Admit: 2019-01-11 | Discharge: 2019-01-13 | DRG: 470 | Disposition: A | Discharge status: Home / Self Care | Payer: PPO | Attending: Orthopedic Surgery | Admitting: Orthopedic Surgery

## 2019-01-11 ENCOUNTER — Inpatient Hospital Stay (HOSPITAL_COMMUNITY): Payer: PPO | Admitting: Certified Registered Nurse Anesthetist

## 2019-01-11 ENCOUNTER — Encounter (HOSPITAL_COMMUNITY)
Admission: RE | Disposition: A | Discharge status: Home / Self Care | Payer: Self-pay | Source: Home / Self Care | Attending: Orthopedic Surgery

## 2019-01-11 ENCOUNTER — Other Ambulatory Visit: Payer: Self-pay

## 2019-01-11 ENCOUNTER — Inpatient Hospital Stay (HOSPITAL_COMMUNITY): Payer: PPO | Admitting: Physician Assistant

## 2019-01-11 DIAGNOSIS — G8918 Other acute postprocedural pain: Secondary | ICD-10-CM | POA: Diagnosis not present

## 2019-01-11 DIAGNOSIS — E7800 Pure hypercholesterolemia, unspecified: Secondary | ICD-10-CM | POA: Diagnosis not present

## 2019-01-11 DIAGNOSIS — Z6838 Body mass index (BMI) 38.0-38.9, adult: Secondary | ICD-10-CM | POA: Diagnosis not present

## 2019-01-11 DIAGNOSIS — E669 Obesity, unspecified: Secondary | ICD-10-CM | POA: Diagnosis not present

## 2019-01-11 DIAGNOSIS — I10 Essential (primary) hypertension: Secondary | ICD-10-CM | POA: Diagnosis not present

## 2019-01-11 DIAGNOSIS — E039 Hypothyroidism, unspecified: Secondary | ICD-10-CM | POA: Diagnosis present

## 2019-01-11 DIAGNOSIS — M1712 Unilateral primary osteoarthritis, left knee: Secondary | ICD-10-CM | POA: Diagnosis not present

## 2019-01-11 DIAGNOSIS — G4730 Sleep apnea, unspecified: Secondary | ICD-10-CM | POA: Diagnosis not present

## 2019-01-11 DIAGNOSIS — Z79891 Long term (current) use of opiate analgesic: Secondary | ICD-10-CM | POA: Diagnosis not present

## 2019-01-11 DIAGNOSIS — M1710 Unilateral primary osteoarthritis, unspecified knee: Secondary | ICD-10-CM | POA: Diagnosis present

## 2019-01-11 DIAGNOSIS — M25762 Osteophyte, left knee: Secondary | ICD-10-CM | POA: Diagnosis not present

## 2019-01-11 DIAGNOSIS — Z888 Allergy status to other drugs, medicaments and biological substances status: Secondary | ICD-10-CM

## 2019-01-11 DIAGNOSIS — M179 Osteoarthritis of knee, unspecified: Secondary | ICD-10-CM

## 2019-01-11 DIAGNOSIS — Z9989 Dependence on other enabling machines and devices: Secondary | ICD-10-CM

## 2019-01-11 DIAGNOSIS — Z79899 Other long term (current) drug therapy: Secondary | ICD-10-CM

## 2019-01-11 HISTORY — PX: TOTAL KNEE ARTHROPLASTY: SHX125

## 2019-01-11 LAB — TYPE AND SCREEN
ABO/RH(D): A NEG
Antibody Screen: NEGATIVE

## 2019-01-11 SURGERY — ARTHROPLASTY, KNEE, TOTAL
Anesthesia: Spinal | Site: Knee | Laterality: Left

## 2019-01-11 MED ORDER — PHENYLEPHRINE 40 MCG/ML (10ML) SYRINGE FOR IV PUSH (FOR BLOOD PRESSURE SUPPORT)
PREFILLED_SYRINGE | INTRAVENOUS | Status: AC
  Filled 2019-01-11: qty 10

## 2019-01-11 MED ORDER — METHOCARBAMOL 500 MG IVPB - SIMPLE MED
INTRAVENOUS | Status: AC
  Administered 2019-01-11: 500 mg via INTRAVENOUS
  Filled 2019-01-11: qty 50

## 2019-01-11 MED ORDER — ACETAMINOPHEN 10 MG/ML IV SOLN: 1000.0000 mg | Freq: Once | INTRAVENOUS | Status: DC | PRN

## 2019-01-11 MED ORDER — EPHEDRINE 5 MG/ML INJ
INTRAVENOUS | Status: AC
  Filled 2019-01-11: qty 10

## 2019-01-11 MED ORDER — BUPIVACAINE IN DEXTROSE 0.75-8.25 % IT SOLN
INTRATHECAL | Status: DC | PRN
  Administered 2019-01-11: 1.6 mL via INTRATHECAL

## 2019-01-11 MED ORDER — CHLORHEXIDINE GLUCONATE 4 % EX LIQD: 60.0000 mL | Freq: Once | CUTANEOUS | Status: DC

## 2019-01-11 MED ORDER — FENTANYL CITRATE (PF) 100 MCG/2ML IJ SOLN: 25.0000 ug | INTRAMUSCULAR | Status: DC | PRN

## 2019-01-11 MED ORDER — LEVOTHYROXINE SODIUM 25 MCG PO TABS
25.0000 ug | ORAL_TABLET | Freq: Every day | ORAL | Status: DC
  Administered 2019-01-12 – 2019-01-13 (×2): 25 ug via ORAL
  Filled 2019-01-11 (×2): qty 1

## 2019-01-11 MED ORDER — SODIUM CHLORIDE (PF) 0.9 % IJ SOLN
INTRAMUSCULAR | Status: AC
  Filled 2019-01-11: qty 50

## 2019-01-11 MED ORDER — RALOXIFENE HCL 60 MG PO TABS
60.0000 mg | ORAL_TABLET | Freq: Every day | ORAL | Status: DC
  Administered 2019-01-12 – 2019-01-13 (×2): 60 mg via ORAL
  Filled 2019-01-11 (×2): qty 1

## 2019-01-11 MED ORDER — ONDANSETRON HCL 4 MG/2ML IJ SOLN
INTRAMUSCULAR | Status: DC | PRN
  Administered 2019-01-11: 4 mg via INTRAVENOUS

## 2019-01-11 MED ORDER — SODIUM CHLORIDE (PF) 0.9 % IJ SOLN
INTRAMUSCULAR | Status: AC
  Filled 2019-01-11: qty 10

## 2019-01-11 MED ORDER — PROMETHAZINE HCL 25 MG/ML IJ SOLN: 6.2500 mg | INTRAMUSCULAR | Status: DC | PRN

## 2019-01-11 MED ORDER — ONDANSETRON HCL 4 MG/2ML IJ SOLN: 4.0000 mg | Freq: Four times a day (QID) | INTRAMUSCULAR | Status: DC | PRN

## 2019-01-11 MED ORDER — DOCUSATE SODIUM 100 MG PO CAPS
100.0000 mg | ORAL_CAPSULE | Freq: Two times a day (BID) | ORAL | Status: DC
  Administered 2019-01-11 – 2019-01-13 (×4): 100 mg via ORAL
  Filled 2019-01-11 (×4): qty 1

## 2019-01-11 MED ORDER — PROPOFOL 10 MG/ML IV BOLUS
INTRAVENOUS | Status: AC
  Filled 2019-01-11: qty 20

## 2019-01-11 MED ORDER — FENTANYL CITRATE (PF) 100 MCG/2ML IJ SOLN
50.0000 ug | INTRAMUSCULAR | Status: DC
  Administered 2019-01-11: 50 ug via INTRAVENOUS
  Filled 2019-01-11: qty 2

## 2019-01-11 MED ORDER — SODIUM CHLORIDE (PF) 0.9 % IJ SOLN
INTRAMUSCULAR | Status: DC | PRN
  Administered 2019-01-11: 60 mL

## 2019-01-11 MED ORDER — TRANEXAMIC ACID-NACL 1000-0.7 MG/100ML-% IV SOLN
1000.0000 mg | INTRAVENOUS | Status: AC
  Administered 2019-01-11: 1000 mg via INTRAVENOUS
  Filled 2019-01-11: qty 100

## 2019-01-11 MED ORDER — BUPIVACAINE LIPOSOME 1.3 % IJ SUSP
INTRAMUSCULAR | Status: DC | PRN
  Administered 2019-01-11: 20 mL

## 2019-01-11 MED ORDER — BISACODYL 10 MG RE SUPP: 10.0000 mg | Freq: Every day | RECTAL | Status: DC | PRN

## 2019-01-11 MED ORDER — METOCLOPRAMIDE HCL 5 MG PO TABS: 5.0000 mg | ORAL_TABLET | Freq: Three times a day (TID) | ORAL | Status: DC | PRN

## 2019-01-11 MED ORDER — LOSARTAN POTASSIUM 50 MG PO TABS
50.0000 mg | ORAL_TABLET | Freq: Every day | ORAL | Status: DC
  Administered 2019-01-12 – 2019-01-13 (×2): 50 mg via ORAL
  Filled 2019-01-11 (×2): qty 1

## 2019-01-11 MED ORDER — PROPOFOL 10 MG/ML IV BOLUS
INTRAVENOUS | Status: AC
  Filled 2019-01-11: qty 40

## 2019-01-11 MED ORDER — OXYCODONE HCL 5 MG PO TABS: 5.0000 mg | ORAL_TABLET | Freq: Once | ORAL | Status: DC | PRN

## 2019-01-11 MED ORDER — ASPIRIN EC 325 MG PO TBEC
325.0000 mg | DELAYED_RELEASE_TABLET | Freq: Two times a day (BID) | ORAL | Status: DC
  Administered 2019-01-12 – 2019-01-13 (×3): 325 mg via ORAL
  Filled 2019-01-11 (×3): qty 1

## 2019-01-11 MED ORDER — MORPHINE SULFATE (PF) 2 MG/ML IV SOLN
1.0000 mg | INTRAVENOUS | Status: DC | PRN
  Administered 2019-01-11: 1 mg via INTRAVENOUS
  Filled 2019-01-11: qty 1

## 2019-01-11 MED ORDER — DIPHENHYDRAMINE HCL 12.5 MG/5ML PO ELIX: 12.5000 mg | ORAL_SOLUTION | ORAL | Status: DC | PRN

## 2019-01-11 MED ORDER — POLYETHYLENE GLYCOL 3350 17 G PO PACK: 17.0000 g | PACK | Freq: Every day | ORAL | Status: DC | PRN

## 2019-01-11 MED ORDER — FLEET ENEMA 7-19 GM/118ML RE ENEM: 1.0000 | ENEMA | Freq: Once | RECTAL | Status: DC | PRN

## 2019-01-11 MED ORDER — PHENOL 1.4 % MT LIQD
1.0000 | OROMUCOSAL | Status: DC | PRN
  Filled 2019-01-11: qty 177

## 2019-01-11 MED ORDER — DEXAMETHASONE SODIUM PHOSPHATE 10 MG/ML IJ SOLN
INTRAMUSCULAR | Status: AC
  Filled 2019-01-11: qty 1

## 2019-01-11 MED ORDER — BUPIVACAINE-EPINEPHRINE (PF) 0.5% -1:200000 IJ SOLN
INTRAMUSCULAR | Status: DC | PRN
  Administered 2019-01-11: 15 mL via PERINEURAL

## 2019-01-11 MED ORDER — ACETAMINOPHEN 500 MG PO TABS
1000.0000 mg | ORAL_TABLET | Freq: Four times a day (QID) | ORAL | Status: AC
  Administered 2019-01-11 – 2019-01-12 (×4): 1000 mg via ORAL
  Filled 2019-01-11 (×4): qty 2

## 2019-01-11 MED ORDER — OXYCODONE HCL 5 MG/5ML PO SOLN
5.0000 mg | Freq: Once | ORAL | Status: DC | PRN
  Filled 2019-01-11: qty 5

## 2019-01-11 MED ORDER — SODIUM CHLORIDE 0.9 % IR SOLN
Status: DC | PRN
  Administered 2019-01-11: 1000 mL

## 2019-01-11 MED ORDER — EPHEDRINE SULFATE-NACL 50-0.9 MG/10ML-% IV SOSY
PREFILLED_SYRINGE | INTRAVENOUS | Status: DC | PRN
  Administered 2019-01-11 (×2): 10 mg via INTRAVENOUS

## 2019-01-11 MED ORDER — METHOCARBAMOL 500 MG PO TABS
500.0000 mg | ORAL_TABLET | Freq: Four times a day (QID) | ORAL | Status: DC | PRN
  Administered 2019-01-11 – 2019-01-12 (×2): 500 mg via ORAL
  Filled 2019-01-11 (×3): qty 1

## 2019-01-11 MED ORDER — DEXAMETHASONE SODIUM PHOSPHATE 10 MG/ML IJ SOLN
10.0000 mg | Freq: Once | INTRAMUSCULAR | Status: AC
  Administered 2019-01-12: 10 mg via INTRAVENOUS
  Filled 2019-01-11: qty 1

## 2019-01-11 MED ORDER — ATORVASTATIN CALCIUM 10 MG PO TABS
10.0000 mg | ORAL_TABLET | Freq: Every day | ORAL | Status: DC
  Administered 2019-01-11 – 2019-01-12 (×2): 10 mg via ORAL
  Filled 2019-01-11 (×2): qty 1

## 2019-01-11 MED ORDER — STERILE WATER FOR IRRIGATION IR SOLN
Status: DC | PRN
  Administered 2019-01-11: 2000 mL

## 2019-01-11 MED ORDER — PHENYLEPHRINE 40 MCG/ML (10ML) SYRINGE FOR IV PUSH (FOR BLOOD PRESSURE SUPPORT)
PREFILLED_SYRINGE | INTRAVENOUS | Status: DC | PRN
  Administered 2019-01-11 (×9): 80 ug via INTRAVENOUS

## 2019-01-11 MED ORDER — PROPOFOL 10 MG/ML IV BOLUS
INTRAVENOUS | Status: DC | PRN
  Administered 2019-01-11: 10 mg via INTRAVENOUS

## 2019-01-11 MED ORDER — TRAMADOL HCL 50 MG PO TABS
50.0000 mg | ORAL_TABLET | Freq: Four times a day (QID) | ORAL | Status: DC | PRN
  Administered 2019-01-11: 50 mg via ORAL
  Filled 2019-01-11: qty 1

## 2019-01-11 MED ORDER — MIDAZOLAM HCL 2 MG/2ML IJ SOLN
1.0000 mg | INTRAMUSCULAR | Status: DC
  Administered 2019-01-11: 1 mg via INTRAVENOUS
  Filled 2019-01-11: qty 2

## 2019-01-11 MED ORDER — LACTATED RINGERS IV SOLN
INTRAVENOUS | Status: DC
  Administered 2019-01-11 (×2): via INTRAVENOUS

## 2019-01-11 MED ORDER — DEXAMETHASONE SODIUM PHOSPHATE 10 MG/ML IJ SOLN
8.0000 mg | Freq: Once | INTRAMUSCULAR | Status: AC
  Administered 2019-01-11: 5 mg via INTRAVENOUS

## 2019-01-11 MED ORDER — TRANEXAMIC ACID-NACL 1000-0.7 MG/100ML-% IV SOLN
1000.0000 mg | Freq: Once | INTRAVENOUS | Status: AC
  Administered 2019-01-11: 1000 mg via INTRAVENOUS
  Filled 2019-01-11: qty 100

## 2019-01-11 MED ORDER — MENTHOL 3 MG MT LOZG: 1.0000 | LOZENGE | OROMUCOSAL | Status: DC | PRN

## 2019-01-11 MED ORDER — ONDANSETRON HCL 4 MG/2ML IJ SOLN
INTRAMUSCULAR | Status: AC
  Filled 2019-01-11: qty 2

## 2019-01-11 MED ORDER — CEFAZOLIN SODIUM-DEXTROSE 2-4 GM/100ML-% IV SOLN
2.0000 g | INTRAVENOUS | Status: AC
  Administered 2019-01-11: 2 g via INTRAVENOUS
  Filled 2019-01-11: qty 100

## 2019-01-11 MED ORDER — ACETAMINOPHEN 10 MG/ML IV SOLN
1000.0000 mg | Freq: Four times a day (QID) | INTRAVENOUS | Status: DC
  Administered 2019-01-11: 1000 mg via INTRAVENOUS
  Filled 2019-01-11: qty 100

## 2019-01-11 MED ORDER — SODIUM CHLORIDE 0.9 % IV SOLN
INTRAVENOUS | Status: DC
  Administered 2019-01-11 – 2019-01-12 (×2): via INTRAVENOUS

## 2019-01-11 MED ORDER — OXYCODONE HCL 5 MG PO TABS
5.0000 mg | ORAL_TABLET | ORAL | Status: DC | PRN
  Administered 2019-01-11: 5 mg via ORAL
  Administered 2019-01-12: 10 mg via ORAL
  Administered 2019-01-12: 5 mg via ORAL
  Administered 2019-01-12 – 2019-01-13 (×5): 10 mg via ORAL
  Filled 2019-01-11: qty 1
  Filled 2019-01-11 (×6): qty 2
  Filled 2019-01-11: qty 1
  Filled 2019-01-11 (×2): qty 2

## 2019-01-11 MED ORDER — METHOCARBAMOL 500 MG IVPB - SIMPLE MED
500.0000 mg | Freq: Four times a day (QID) | INTRAVENOUS | Status: DC | PRN
  Administered 2019-01-11: 500 mg via INTRAVENOUS
  Filled 2019-01-11: qty 50

## 2019-01-11 MED ORDER — PROPOFOL 500 MG/50ML IV EMUL
INTRAVENOUS | Status: DC | PRN
  Administered 2019-01-11: 75 ug/kg/min via INTRAVENOUS

## 2019-01-11 MED ORDER — ONDANSETRON HCL 4 MG PO TABS: 4.0000 mg | ORAL_TABLET | Freq: Four times a day (QID) | ORAL | Status: DC | PRN

## 2019-01-11 MED ORDER — CEFAZOLIN SODIUM-DEXTROSE 2-4 GM/100ML-% IV SOLN
2.0000 g | Freq: Four times a day (QID) | INTRAVENOUS | Status: AC
  Administered 2019-01-11 – 2019-01-12 (×2): 2 g via INTRAVENOUS
  Filled 2019-01-11 (×2): qty 100

## 2019-01-11 MED ORDER — METOCLOPRAMIDE HCL 5 MG/ML IJ SOLN: 5.0000 mg | Freq: Three times a day (TID) | INTRAMUSCULAR | Status: DC | PRN

## 2019-01-11 SURGICAL SUPPLY — 61 items
BAG SPEC THK2 15X12 ZIP CLS (MISCELLANEOUS) ×1
BAG ZIPLOCK 12X15 (MISCELLANEOUS) ×3 IMPLANT
BANDAGE ACE 6X5 VEL STRL LF (GAUZE/BANDAGES/DRESSINGS) ×3 IMPLANT
BLADE SAG 18X100X1.27 (BLADE) ×3 IMPLANT
BLADE SAW SGTL 11.0X1.19X90.0M (BLADE) ×3 IMPLANT
BLADE SURG SZ10 CARB STEEL (BLADE) ×6 IMPLANT
BOWL SMART MIX CTS (DISPOSABLE) ×3 IMPLANT
CEMENT HV SMART SET (Cement) ×6 IMPLANT
CEMENT TIBIA MBT SIZE 2.5 (Knees) IMPLANT
CLOSURE WOUND 1/2 X4 (GAUZE/BANDAGES/DRESSINGS) ×2
COVER SURGICAL LIGHT HANDLE (MISCELLANEOUS) ×3 IMPLANT
COVER WAND RF STERILE (DRAPES) ×2 IMPLANT
CUFF TOURN SGL QUICK 34 (TOURNIQUET CUFF) ×3
CUFF TRNQT CYL 34X4X40X1 (TOURNIQUET CUFF) ×1 IMPLANT
DECANTER SPIKE VIAL GLASS SM (MISCELLANEOUS) ×5 IMPLANT
DRAPE U-SHAPE 47X51 STRL (DRAPES) ×3 IMPLANT
DRSG ADAPTIC 3X8 NADH LF (GAUZE/BANDAGES/DRESSINGS) ×3 IMPLANT
DRSG PAD ABDOMINAL 8X10 ST (GAUZE/BANDAGES/DRESSINGS) ×3 IMPLANT
DURAPREP 26ML APPLICATOR (WOUND CARE) ×3 IMPLANT
ELECT REM PT RETURN 15FT ADLT (MISCELLANEOUS) ×3 IMPLANT
EVACUATOR 1/8 PVC DRAIN (DRAIN) ×3 IMPLANT
GAUZE SPONGE 4X4 12PLY STRL (GAUZE/BANDAGES/DRESSINGS) ×3 IMPLANT
GLOVE BIO SURGEON STRL SZ7 (GLOVE) ×3 IMPLANT
GLOVE BIO SURGEON STRL SZ8 (GLOVE) ×3 IMPLANT
GLOVE BIOGEL PI IND STRL 6.5 (GLOVE) ×1 IMPLANT
GLOVE BIOGEL PI IND STRL 7.0 (GLOVE) ×1 IMPLANT
GLOVE BIOGEL PI IND STRL 8 (GLOVE) ×1 IMPLANT
GLOVE BIOGEL PI INDICATOR 6.5 (GLOVE) ×2
GLOVE BIOGEL PI INDICATOR 7.0 (GLOVE) ×8
GLOVE BIOGEL PI INDICATOR 8 (GLOVE) ×2
GLOVE SURG SS PI 6.5 STRL IVOR (GLOVE) ×3 IMPLANT
GOWN STRL REUS W/TWL LRG LVL3 (GOWN DISPOSABLE) ×6 IMPLANT
GOWN STRL REUS W/TWL XL LVL3 (GOWN DISPOSABLE) ×5 IMPLANT
HANDPIECE INTERPULSE COAX TIP (DISPOSABLE) ×3
HOLDER FOLEY CATH W/STRAP (MISCELLANEOUS) ×2 IMPLANT
IMMOBILIZER KNEE 20 (SOFTGOODS) ×3
IMMOBILIZER KNEE 20 THIGH 36 (SOFTGOODS) ×1 IMPLANT
IMPL FEMUR SIGMA LT PS SZ 3 (Knees) IMPLANT
IMPLANT FEMUR SIGMA LT PS SZ 3 (Knees) ×3 IMPLANT
MANIFOLD NEPTUNE II (INSTRUMENTS) ×3 IMPLANT
NS IRRIG 1000ML POUR BTL (IV SOLUTION) ×3 IMPLANT
PACK TOTAL KNEE CUSTOM (KITS) ×3 IMPLANT
PAD ABD 8X10 STRL (GAUZE/BANDAGES/DRESSINGS) ×2 IMPLANT
PADDING CAST COTTON 6X4 STRL (CAST SUPPLIES) ×7 IMPLANT
PATELLA DOME PFC 35MM (Knees) ×2 IMPLANT
PIN STEINMAN FIXATION KNEE (PIN) ×2 IMPLANT
PLATE ROT INSERT 12.5MM SIZE 3 (Plate) ×2 IMPLANT
PROTECTOR NERVE ULNAR (MISCELLANEOUS) ×3 IMPLANT
SET HNDPC FAN SPRY TIP SCT (DISPOSABLE) ×1 IMPLANT
STRIP CLOSURE SKIN 1/2X4 (GAUZE/BANDAGES/DRESSINGS) ×4 IMPLANT
SUT MNCRL AB 4-0 PS2 18 (SUTURE) ×3 IMPLANT
SUT STRATAFIX 0 PDS 27 VIOLET (SUTURE) ×3
SUT VIC AB 2-0 CT1 27 (SUTURE) ×9
SUT VIC AB 2-0 CT1 TAPERPNT 27 (SUTURE) ×3 IMPLANT
SUTURE STRATFX 0 PDS 27 VIOLET (SUTURE) ×1 IMPLANT
TIBIA MBT CEMENT SIZE 2.5 (Knees) ×3 IMPLANT
TRAY FOLEY CATH 14FRSI W/METER (CATHETERS) ×2 IMPLANT
TRAY FOLEY MTR SLVR 16FR STAT (SET/KITS/TRAYS/PACK) ×1 IMPLANT
WATER STERILE IRR 1000ML POUR (IV SOLUTION) ×6 IMPLANT
WRAP KNEE MAXI GEL POST OP (GAUZE/BANDAGES/DRESSINGS) ×3 IMPLANT
YANKAUER SUCT BULB TIP 10FT TU (MISCELLANEOUS) ×3 IMPLANT

## 2019-01-11 NOTE — Progress Notes (Signed)
Assisted Dr. Howze with left, ultrasound guided, adductor canal block. Side rails up, monitors on throughout procedure. See vital signs in flow sheet. Tolerated Procedure well.  

## 2019-01-11 NOTE — Transfer of Care (Signed)
Immediate Anesthesia Transfer of Care Note  Patient: Mary Hayes  Procedure(s) Performed: TOTAL KNEE ARTHROPLASTY (Left Knee)  Patient Location: PACU  Anesthesia Type:Spinal and MAC combined with regional for post-op pain  Level of Consciousness: awake, alert , oriented and patient cooperative  Airway & Oxygen Therapy: Patient Spontanous Breathing and Patient connected to face mask oxygen  Post-op Assessment: Report given to RN and Post -op Vital signs reviewed and stable  Post vital signs: Reviewed and stable  Last Vitals:  Vitals Value Taken Time  BP    Temp    Pulse 83 01/11/2019  4:07 PM  Resp 17 01/11/2019  4:07 PM  SpO2 99 % 01/11/2019  4:07 PM  Vitals shown include unvalidated device data.  Last Pain:  Vitals:   01/11/19 1325  PainSc: 0-No pain         Complications: No apparent anesthesia complications

## 2019-01-11 NOTE — Progress Notes (Signed)
Pt placed on nocturnal cpap, autotitration mode with 2l o2 bleedin.  Upon placement, pt stated she is "too nervous" and immediately took the mask off.  RN aware, present in room.  Pt placed back on 2lnc.  Pt is aware that RT is available all night should she change her mind.

## 2019-01-11 NOTE — Anesthesia Preprocedure Evaluation (Addendum)
Anesthesia Evaluation  Patient identified by MRN, date of birth, ID band Patient awake    Reviewed: Allergy & Precautions, NPO status , Patient's Chart, lab work & pertinent test results  History of Anesthesia Complications Negative for: history of anesthetic complications  Airway Mallampati: II  TM Distance: >3 FB Neck ROM: Full    Dental  (+) Dental Advisory Given, Missing,    Pulmonary sleep apnea and Continuous Positive Airway Pressure Ventilation ,    Pulmonary exam normal breath sounds clear to auscultation       Cardiovascular hypertension, Pt. on medications Normal cardiovascular exam Rhythm:Regular Rate:Normal     Neuro/Psych negative neurological ROS     GI/Hepatic negative GI ROS, Neg liver ROS,   Endo/Other  Hypothyroidism   Renal/GU negative Renal ROS     Musculoskeletal  (+) Arthritis , Osteoarthritis,    Abdominal   Peds  Hematology negative hematology ROS (+)   Anesthesia Other Findings Day of surgery medications reviewed with the patient.  Reproductive/Obstetrics                            Anesthesia Physical Anesthesia Plan  ASA: II  Anesthesia Plan: Spinal   Post-op Pain Management:  Regional for Post-op pain   Induction:   PONV Risk Score and Plan: Treatment may vary due to age or medical condition, Ondansetron and Propofol infusion  Airway Management Planned: Natural Airway and Simple Face Mask  Additional Equipment:   Intra-op Plan:   Post-operative Plan:   Informed Consent: I have reviewed the patients History and Physical, chart, labs and discussed the procedure including the risks, benefits and alternatives for the proposed anesthesia with the patient or authorized representative who has indicated his/her understanding and acceptance.     Dental advisory given  Plan Discussed with: CRNA  Anesthesia Plan Comments:        Anesthesia Quick  Evaluation

## 2019-01-11 NOTE — Anesthesia Procedure Notes (Signed)
Procedure Name: MAC Date/Time: 01/11/2019 2:21 PM Performed by: West Pugh, CRNA Pre-anesthesia Checklist: Patient identified, Emergency Drugs available, Suction available, Patient being monitored and Timeout performed Patient Re-evaluated:Patient Re-evaluated prior to induction Oxygen Delivery Method: Simple face mask Preoxygenation: Pre-oxygenation with 100% oxygen Induction Type: IV induction Placement Confirmation: positive ETCO2 Dental Injury: Teeth and Oropharynx as per pre-operative assessment

## 2019-01-11 NOTE — Progress Notes (Addendum)
Interpreter Sharee Pimple [phone] RN used interpreter to give medicine and do a reassessment.

## 2019-01-11 NOTE — Op Note (Signed)
OPERATIVE REPORT-TOTAL KNEE ARTHROPLASTY   Pre-operative diagnosis- Osteoarthritis  Left knee(s)  Post-operative diagnosis- Osteoarthritis Left knee(s)  Procedure-  Left  Total Knee Arthroplasty  Surgeon- Dione Plover. Annabelle Rexroad, MD  Assistant- Griffith Citron, PA-C   Anesthesia-  Adductor canal block and spinal  EBL- 25 m l   Drains Hemovac  Tourniquet time-  Total Tourniquet Time Documented: Thigh (Left) - 44 minutes Total: Thigh (Left) - 44 minutes     Complications- None  Condition-PACU - hemodynamically stable.   Brief Clinical Note  Mary Hayes is a 78 y.o. year old female with end stage OA of her left knee with progressively worsening pain and dysfunction. She has constant pain, with activity and at rest and significant functional deficits with difficulties even with ADLs. She has had extensive non-op management including analgesics, injections of cortisone and viscosupplements, and home exercise program, but remains in significant pain with significant dysfunction. Radiographs show bone on bone arthritis all 3 compartments. She presents now for left Total Knee Arthroplasty.    Procedure in detail---   The patient is brought into the operating room and positioned supine on the operating table. After successful administration of  Adductor canal block and spinal,   a tourniquet is placed high on the  Left thigh(s) and the lower extremity is prepped and draped in the usual sterile fashion. Time out is performed by the operating team and then the  Left lower extremity is wrapped in Esmarch, knee flexed and the tourniquet inflated to 300 mmHg.       A midline incision is made with a ten blade through the subcutaneous tissue to the level of the extensor mechanism. A fresh blade is used to make a medial parapatellar arthrotomy. Soft tissue over the proximal medial tibia is subperiosteally elevated to the joint line with a knife and into the semimembranosus bursa with a Cobb  elevator. Soft tissue over the proximal lateral tibia is elevated with attention being paid to avoiding the patellar tendon on the tibial tubercle. The patella is everted, knee flexed 90 degrees and the ACL and PCL are removed. Findings are bone on bone all 3 compartments with massive global oseophytes.        The drill is used to create a starting hole in the distal femur and the canal is thoroughly irrigated with sterile saline to remove the fatty contents. The 5 degree Left  valgus alignment guide is placed into the femoral canal and the distal femoral cutting block is pinned to remove 10 mm off the distal femur. Resection is made with an oscillating saw.      The tibia is subluxed forward and the menisci are removed. The extramedullary alignment guide is placed referencing proximally at the medial aspect of the tibial tubercle and distally along the second metatarsal axis and tibial crest. The block is pinned to remove 70mm off the more deficient medial  side. Resection is made with an oscillating saw. Size 2.5is the most appropriate size for the tibia and the proximal tibia is prepared with the modular drill and keel punch for that size.      The femoral sizing guide is placed and size 3 is most appropriate. Rotation is marked off the epicondylar axis and confirmed by creating a rectangular flexion gap at 90 degrees. The size 3 cutting block is pinned in this rotation and the anterior, posterior and chamfer cuts are made with the oscillating saw. The intercondylar block is then placed and that cut is  made.      Trial size 2.5 tibial component, trial size 3 posterior stabilized femur and a 12.5  mm posterior stabilized rotating platform insert trial is placed. Full extension is achieved with excellent varus/valgus and anterior/posterior balance throughout full range of motion. The patella is everted and thickness measured to be 24  mm. Free hand resection is taken to 14 mm, a 35 template is placed, lug holes  are drilled, trial patella is placed, and it tracks normally. Osteophytes are removed off the posterior femur with the trial in place. All trials are removed and the cut bone surfaces prepared with pulsatile lavage. Cement is mixed and once ready for implantation, the size 2.5 tibial implant, size  3 posterior stabilized femoral component, and the size 35 patella are cemented in place and the patella is held with the clamp. The trial insert is placed and the knee held in full extension. The Exparel (20 ml mixed with 60 ml saline) is injected into the extensor mechanism, posterior capsule, medial and lateral gutters and subcutaneous tissues.  All extruded cement is removed and once the cement is hard the permanent 12.5 mm posterior stabilized rotating platform insert is placed into the tibial tray.      The wound is copiously irrigated with saline solution and the extensor mechanism closed over a hemovac drain with #1 V-loc suture. The tourniquet is released for a total tourniquet time of 44  minutes. Flexion against gravity is 140 degrees and the patella tracks normally. Subcutaneous tissue is closed with 2.0 vicryl and subcuticular with running 4.0 Monocryl. The incision is cleaned and dried and steri-strips and a bulky sterile dressing are applied. The limb is placed into a knee immobilizer and the patient is awakened and transported to recovery in stable condition.      Please note that a surgical assistant was a medical necessity for this procedure in order to perform it in a safe and expeditious manner. Surgical assistant was necessary to retract the ligaments and vital neurovascular structures to prevent injury to them and also necessary for proper positioning of the limb to allow for anatomic placement of the prosthesis.   Dione Plover Caelynn Marshman, MD    01/11/2019, 3:39 PM

## 2019-01-11 NOTE — Interval H&P Note (Signed)
History and Physical Interval Note:  01/11/2019 11:42 AM  Mary Hayes  has presented today for surgery, with the diagnosis of left knee osteoarthritis  The various methods of treatment have been discussed with the patient and family. After consideration of risks, benefits and other options for treatment, the patient has consented to  Procedure(s) with comments: TOTAL KNEE ARTHROPLASTY (Left) - 24min as a surgical intervention .  The patient's history has been reviewed, patient examined, no change in status, stable for surgery.  I have reviewed the patient's chart and labs.  Questions were answered to the patient's satisfaction.     Pilar Plate Amauri Keefe

## 2019-01-11 NOTE — Discharge Instructions (Signed)
° °Dr. Frank Aluisio °Total Joint Specialist °[facility] °[address] °[place], [place] [address] °[phone] ° °TOTAL KNEE REPLACEMENT POSTOPERATIVE DIRECTIONS ° °Knee Rehabilitation, Guidelines Following Surgery  °Results after knee surgery are often greatly improved when you follow the exercise, range of motion and muscle strengthening exercises prescribed by your doctor. Safety measures are also important to protect the knee from further injury. Any time any of these exercises cause you to have increased pain or swelling in your knee joint, decrease the amount until you are comfortable again and slowly increase them. If you have problems or questions, call your caregiver or physical therapist for advice.  ° °HOME CARE INSTRUCTIONS  °• Remove items at home which could result in a fall. This includes throw rugs or furniture in walking pathways.  °· ICE to the affected knee every three hours for 30 minutes at a time and then as needed for pain and swelling.  Continue to use ice on the knee for pain and swelling from surgery. You may notice swelling that will progress down to the foot and ankle.  This is normal after surgery.  Elevate the leg when you are not up walking on it.   °· Continue to use the breathing machine which will help keep your temperature down.  It is common for your temperature to cycle up and down following surgery, especially at night when you are not up moving around and exerting yourself.  The breathing machine keeps your lungs expanded and your temperature down. °· Do not place pillow under knee, focus on keeping the knee straight while resting ° °DIET °You may resume your previous home diet once your are discharged from the hospital. ° °DRESSING / WOUND CARE / SHOWERING °You may shower 3 days after surgery, but keep the wounds dry during showering.  You may use an occlusive plastic wrap (Press'n Seal for example), NO SOAKING/SUBMERGING IN THE BATHTUB.  If the bandage  gets wet, change with a clean dry gauze.  If the incision gets wet, pat the wound dry with a clean towel. °You may start showering once you are discharged home but do not submerge the incision under water. Just pat the incision dry and apply a dry gauze dressing on daily. °Change the surgical dressing daily and reapply a dry dressing each time. ° °ACTIVITY °Walk with your walker as instructed. °Use walker as long as suggested by your caregivers. °Avoid periods of inactivity such as sitting longer than an hour when not asleep. This helps prevent blood clots.  °You may resume a sexual relationship in one month or when given the OK by your doctor.  °You may return to work once you are cleared by your doctor.  °Do not drive a car for 6 weeks or until released by you surgeon.  °Do not drive while taking narcotics. ° °WEIGHT BEARING °Weight bearing as tolerated with assist device (walker, cane, etc) as directed, use it as long as suggested by your surgeon or therapist, typically at least 4-6 weeks. ° °POSTOPERATIVE CONSTIPATION PROTOCOL °Constipation - defined medically as fewer than three stools per week and severe constipation as less than one stool per week. ° °One of the most common issues patients have following surgery is constipation.  Even if you have a regular bowel pattern at home, your normal regimen is likely to be disrupted due to multiple reasons following surgery.  Combination of anesthesia, postoperative narcotics, change in appetite and fluid intake all can affect your bowels.    In order to avoid complications following surgery, here are some recommendations in order to help you during your recovery period. ° °Colace (docusate) - Pick up an over-the-counter form of Colace or another stool softener and take twice a day as long as you are requiring postoperative pain medications.  Take with a full glass of water daily.  If you experience loose stools or diarrhea, hold the colace until you stool forms back  up.  If your symptoms do not get better within 1 week or if they get worse, check with your doctor. ° °Dulcolax (bisacodyl) - Pick up over-the-counter and take as directed by the product packaging as needed to assist with the movement of your bowels.  Take with a full glass of water.  Use this product as needed if not relieved by Colace only.  ° °MiraLax (polyethylene glycol) - Pick up over-the-counter to have on hand.  MiraLax is a solution that will increase the amount of water in your bowels to assist with bowel movements.  Take as directed and can mix with a glass of water, juice, soda, coffee, or tea.  Take if you go more than two days without a movement. °Do not use MiraLax more than once per day. Call your doctor if you are still constipated or irregular after using this medication for 7 days in a row. ° °If you continue to have problems with postoperative constipation, please contact the office for further assistance and recommendations.  If you experience "the worst abdominal pain ever" or develop nausea or vomiting, please contact the office immediatly for further recommendations for treatment. ° °ITCHING ° If you experience itching with your medications, try taking only a single pain pill, or even half a pain pill at a time.  You can also use Benadryl over the counter for itching or also to help with sleep.  ° °TED HOSE STOCKINGS °Wear the elastic stockings on both legs for three weeks following surgery during the day but you may remove then at night for sleeping. ° °MEDICATIONS °See your medication summary on the “After Visit Summary” that the nursing staff will review with you prior to discharge.  You may have some home medications which will be placed on hold until you complete the course of blood thinner medication.  It is important for you to complete the blood thinner medication as prescribed by your surgeon.  Continue your approved medications as instructed at time of discharge. ° °PRECAUTIONS °If  you experience chest pain or shortness of breath - call 911 immediately for transfer to the hospital emergency department.  °If you develop a fever greater that 101 F, purulent drainage from wound, increased redness or drainage from wound, foul odor from the wound/dressing, or calf pain - CONTACT YOUR SURGEON.   °                                                °FOLLOW-UP APPOINTMENTS °Make sure you keep all of your appointments after your operation with your surgeon and caregivers. You should call the office at the above phone number and make an appointment for approximately two weeks after the date of your surgery or on the date instructed by your surgeon outlined in the "After Visit Summary". ° ° °RANGE OF MOTION AND STRENGTHENING EXERCISES  °Rehabilitation of the knee is important following a knee injury or   an operation. After just a few days of immobilization, the muscles of the thigh which control the knee become weakened and shrink (atrophy). Knee exercises are designed to build up the tone and strength of the thigh muscles and to improve knee motion. Often times heat used for twenty to thirty minutes before working out will loosen up your tissues and help with improving the range of motion but do not use heat for the first two weeks following surgery. These exercises can be done on a training (exercise) mat, on the floor, on a table or on a bed. Use what ever works the best and is most comfortable for you Knee exercises include:  °• Leg Lifts - While your knee is still immobilized in a splint or cast, you can do straight leg raises. Lift the leg to 60 degrees, hold for 3 sec, and slowly lower the leg. Repeat 10-20 times 2-3 times daily. Perform this exercise against resistance later as your knee gets better.  °• Quad and Hamstring Sets - Tighten up the muscle on the front of the thigh (Quad) and hold for 5-10 sec. Repeat this 10-20 times hourly. Hamstring sets are done by pushing the foot backward against an  object and holding for 5-10 sec. Repeat as with quad sets.  °· Leg Slides: Lying on your back, slowly slide your foot toward your buttocks, bending your knee up off the floor (only go as far as is comfortable). Then slowly slide your foot back down until your leg is flat on the floor again. °· Angel Wings: Lying on your back spread your legs to the side as far apart as you can without causing discomfort.  °A rehabilitation program following serious knee injuries can speed recovery and prevent re-injury in the future due to weakened muscles. Contact your doctor or a physical therapist for more information on knee rehabilitation.  ° °IF YOU ARE TRANSFERRED TO A SKILLED REHAB FACILITY °If the patient is transferred to a skilled rehab facility following release from the hospital, a list of the current medications will be sent to the facility for the patient to continue.  When discharged from the skilled rehab facility, please have the facility set up the patient's Home Health Physical Therapy prior to being released. Also, the skilled facility will be responsible for providing the patient with their medications at time of release from the facility to include their pain medication, the muscle relaxants, and their blood thinner medication. If the patient is still at the rehab facility at time of the two week follow up appointment, the skilled rehab facility will also need to assist the patient in arranging follow up appointment in our office and any transportation needs. ° °MAKE SURE YOU:  °• Understand these instructions.  °• Get help right away if you are not doing well or get worse.  ° ° °Pick up stool softner and laxative for home use following surgery while on pain medications. °Do not submerge incision under water. °Please use good hand washing techniques while changing dressing each day. °May shower starting three days after surgery. °Please use a clean towel to pat the incision dry following showers. °Continue to  use ice for pain and swelling after surgery. °Do not use any lotions or creams on the incision until instructed by your surgeon. ° °

## 2019-01-11 NOTE — Anesthesia Procedure Notes (Signed)
Spinal  Patient location during procedure: OR Start time: 01/11/2019 2:23 PM End time: 01/11/2019 2:25 PM Staffing Anesthesiologist: Brennan Bailey, MD Performed: anesthesiologist  Preanesthetic Checklist Completed: patient identified, surgical consent, pre-op evaluation, timeout performed, IV checked, risks and benefits discussed and monitors and equipment checked Spinal Block Patient position: sitting Prep: site prepped and draped and DuraPrep Patient monitoring: cardiac monitor, continuous pulse ox and blood pressure Approach: midline Location: L3-4 Injection technique: single-shot Needle Needle type: Pencan  Needle gauge: 24 G Needle length: 9 cm Additional Notes Risks, benefits, and alternative discussed. Patient gave consent to procedure. Prepped and draped in sitting position. Clear CSF obtained after several needle redirections. Positive terminal aspiration. No pain or paraesthesias with injection. Patient tolerated procedure well. Vital signs stable. Tawny Asal, MD

## 2019-01-11 NOTE — Anesthesia Procedure Notes (Signed)
Anesthesia Regional Block: Adductor canal block   Pre-Anesthetic Checklist: ,, timeout performed, Correct Patient, Correct Site, Correct Laterality, Correct Procedure, Correct Position, site marked, Risks and benefits discussed, pre-op evaluation,  At surgeon's request and post-op pain management  Laterality: Left  Prep: Maximum Sterile Barrier Precautions used, chloraprep       Needles:  Injection technique: Single-shot  Needle Type: Echogenic Stimulator Needle     Needle Length: 9cm  Needle Gauge: 22     Additional Needles:   Procedures:,,,, ultrasound used (permanent image in chart),,,,  Narrative:  Start time: 01/11/2019 1:14 PM End time: 01/11/2019 1:16 PM Injection made incrementally with aspirations every 5 mL.  Performed by: Personally  Anesthesiologist: Brennan Bailey, MD  Additional Notes: Risks, benefits, and alternative discussed. Patient gave consent for procedure. Patient prepped and draped in sterile fashion. Sedation administered, patient remains easily responsive to voice. Relevant anatomy identified with ultrasound guidance. Local anesthetic given in 5cc increments with no signs or symptoms of intravascular injection. No pain or paraesthesias with injection. Patient monitored throughout procedure with signs of LAST or immediate complications. Tolerated well. Ultrasound image placed in chart.  Tawny Asal, MD

## 2019-01-11 NOTE — Anesthesia Postprocedure Evaluation (Signed)
Anesthesia Post Note  Patient: Mary Hayes  Procedure(s) Performed: TOTAL KNEE ARTHROPLASTY (Left Knee)     Patient location during evaluation: PACU Anesthesia Type: Spinal Level of consciousness: awake and alert Pain management: pain level controlled Vital Signs Assessment: post-procedure vital signs reviewed and stable Respiratory status: spontaneous breathing, nonlabored ventilation and respiratory function stable Cardiovascular status: blood pressure returned to baseline and stable Postop Assessment: no apparent nausea or vomiting and spinal receding Anesthetic complications: no    Last Vitals:  Vitals:   01/11/19 1630 01/11/19 1645  BP: 126/68 138/68  Pulse: 76 71  Resp: 19 19  Temp:    SpO2: 99% 97%    Last Pain:  Vitals:   01/11/19 1645  PainSc: 0-No pain                 Brennan Bailey

## 2019-01-12 ENCOUNTER — Encounter (HOSPITAL_COMMUNITY): Payer: Self-pay | Admitting: Orthopedic Surgery

## 2019-01-12 LAB — BASIC METABOLIC PANEL
Anion gap: 9 (ref 5–15)
BUN: 14 mg/dL (ref 8–23)
CO2: 23 mmol/L (ref 22–32)
Calcium: 8.2 mg/dL — ABNORMAL LOW (ref 8.9–10.3)
Chloride: 104 mmol/L (ref 98–111)
Creatinine, Ser: 0.65 mg/dL (ref 0.44–1.00)
GFR calc non Af Amer: >60 mL/min (ref >60)
Glucose, Bld: 146 mg/dL — ABNORMAL HIGH (ref 70–99)
Potassium: 4 mmol/L (ref 3.5–5.1)
SODIUM: 136 mmol/L (ref 135–145)

## 2019-01-12 LAB — CBC
HCT: 33.6 % — ABNORMAL LOW (ref 36.0–46.0)
Hemoglobin: 10.6 g/dL — ABNORMAL LOW (ref 12.0–15.0)
MCH: 28.6 pg (ref 26.0–34.0)
MCHC: 31.5 g/dL (ref 30.0–36.0)
MCV: 90.6 fL (ref 80.0–100.0)
Platelets: 196 10*3/uL (ref 150–400)
RBC: 3.71 MIL/uL — ABNORMAL LOW (ref 3.87–5.11)
RDW: 13.2 % (ref 11.5–15.5)
WBC: 13.5 10*3/uL — ABNORMAL HIGH (ref 4.0–10.5)
nRBC: 0 % (ref 0.0–0.2)

## 2019-01-12 MED ORDER — TRAMADOL HCL 50 MG PO TABS
50.0000 mg | ORAL_TABLET | Freq: Four times a day (QID) | ORAL | Status: DC | PRN
  Administered 2019-01-12: 100 mg via ORAL
  Filled 2019-01-12: qty 2

## 2019-01-12 NOTE — Progress Notes (Signed)
Pt declined use of cpap again tonight stating I can take it out of her room.  Pt encouraged to call should she change her mind.  RN aware.

## 2019-01-12 NOTE — Plan of Care (Signed)
  Problem: Education: Goal: Knowledge of the prescribed therapeutic regimen will improve Outcome: Progressing   Problem: Pain Management: Goal: Pain level will decrease with appropriate interventions Outcome: Progressing   Problem: Clinical Measurements: Goal: Will remain free from infection Outcome: Progressing Goal: Respiratory complications will improve Outcome: Progressing   Problem: Safety: Goal: Ability to remain free from injury will improve Outcome: Progressing

## 2019-01-12 NOTE — Progress Notes (Signed)
   Subjective: 1 Day Post-Op Procedure(s) (LRB): TOTAL KNEE ARTHROPLASTY (Left) Patient reports pain as moderate.   Patient seen in rounds by Dr. Wynelle Link. Patient is well, and has had no acute complaints or problems other than pain in the left knee. Denies chest pain, SOB, or calf pain. Foley catheter removed this AM. No issues overnight.  We will start therapy today.   Objective: Vital signs in last 24 hours: Temp:  [97.7 F (36.5 C)-98.3 F (36.8 C)] 98.3 F (36.8 C) (01/21 0626) Pulse Rate:  [60-85] 77 (01/21 0626) Resp:  [14-23] 18 (01/21 0626) BP: (109-195)/(49-113) 138/74 (01/21 0626) SpO2:  [91 %-100 %] 96 % (01/21 0626)  Intake/Output from previous day:  Intake/Output Summary (Last 24 hours) at 01/12/2019 0718 Last data filed at 01/12/2019 2878 Gross per 24 hour  Intake 3535 ml  Output 4755 ml  Net -1220 ml    Labs: Recent Labs    01/12/19 0440  HGB 10.6*   Recent Labs    01/12/19 0440  WBC 13.5*  RBC 3.71*  HCT 33.6*  PLT 196   Recent Labs    01/12/19 0440  NA 136  K 4.0  CL 104  CO2 23  BUN 14  CREATININE 0.65  GLUCOSE 146*  CALCIUM 8.2*   Exam: General - Patient is Alert and Oriented Extremity - Neurologically intact Neurovascular intact Sensation intact distally Dorsiflexion/Plantar flexion intact Dressing - dressing C/D/I Motor Function - intact, moving foot and toes well on exam.   Past Medical History:  Diagnosis Date  . Arthritis   . Hypercholesteremia   . Hypertension   . Hypothyroidism   . Sleep apnea    cpap    Assessment/Plan: 1 Day Post-Op Procedure(s) (LRB): TOTAL KNEE ARTHROPLASTY (Left) Principal Problem:   OA (osteoarthritis) of knee  Estimated body mass index is 38.08 kg/m as calculated from the following:   Height as of 01/06/19: 5' (1.524 m).   Weight as of 01/06/19: 88.5 kg. Advance diet Up with therapy  Anticipated LOS equal to or greater than 2 midnights due to - Age 8 and older with one or more of the  following:  - Obesity  - Expected need for hospital services (PT, OT, Nursing) required for safe  discharge  - Anticipated need for postoperative skilled nursing care or inpatient rehab  - Active co-morbidities: None OR   - Unanticipated findings during/Post Surgery: None  - Patient is a high risk of re-admission due to: None    DVT Prophylaxis - Aspirin Weight bearing as tolerated. D/C O2 and pulse ox and try on room air. Hemovac pulled without difficulty, will begin therapy today.  Plan is to go Home after hospital stay. Plan for discharge tomorrow as long as meeting goals with therapy.  Theresa Duty, PA-C Orthopedic Surgery 01/12/2019, 7:18 AM

## 2019-01-12 NOTE — Progress Notes (Signed)
Physical Therapy Treatment Patient Details Name: Mary Hayes MRN: [id number] DOB: 10-14-1941 Today's Date: 01/12/2019    History of Present Illness s/p L TKA    PT Comments    Pt progressing, gait distance limited by pain and slight dizziness; reviewed HEP and pt reported pain did not increase; continue PT POC   Follow Up Recommendations  Follow surgeon's recommendation for DC plan and follow-up therapies     Equipment Recommendations  None recommended by PT    Recommendations for Other Services       Precautions / Restrictions Precautions Precautions: Fall;Knee Precaution Comments: able to perform independent SLRs today Required Braces or Orthoses: Knee Immobilizer - Left Knee Immobilizer - Left: Discontinue once straight leg raise with < 10 degree lag Restrictions Weight Bearing Restrictions: No Other Position/Activity Restrictions: WBAT    Mobility  Bed Mobility Overal bed mobility: Needs Assistance Bed Mobility: Sit to Supine       Sit to supine: Min assist   General bed mobility comments: light assist with LLE onto bed  Transfers Overall transfer level: Needs assistance Equipment used: Rolling walker (2 wheeled) Transfers: Sit to/from Stand Sit to Stand: Min assist;Min guard         General transfer comment: multi-modal cues for hand placement and LLE management  Ambulation/Gait Ambulation/Gait assistance: Min assist;Min guard Gait Distance (Feet): 11 Feet(x2) Assistive device: Rolling walker (2 wheeled) Gait Pattern/deviations: Step-to pattern;Antalgic Gait velocity: decr   General Gait Details: cues for sequence and safety, distance limited by pain, c/o mild dizziness     Stairs             Wheelchair Mobility    Modified Rankin (Stroke Patients Only)       Balance                                            Cognition Arousal/Alertness: Awake/alert Behavior During Therapy: WFL for tasks  assessed/performed Overall Cognitive Status: Within Functional Limits for tasks assessed                                        Exercises Total Joint Exercises Ankle Circles/Pumps: AROM;Both;10 reps Quad Sets: AROM;Both;10 reps Heel Slides: AROM;AAROM;Left;10 reps Hip ABduction/ADduction: AROM;AAROM;Left;10 reps Straight Leg Raises: AROM;Left;10 reps Goniometric ROM: AAROM grossly -10* to 55* left knee flexion    General Comments        Pertinent Vitals/Pain Pain Assessment: 0-10 Pain Score: 4  Pain Location: left knee Pain Descriptors / Indicators: Grimacing;Guarding;Sore Pain Intervention(s): Limited activity within patient's tolerance;Monitored during session;Premedicated before session;Repositioned    Home Living Family/patient expects to be discharged to:: Private residence Living Arrangements: Alone   Type of Home: House Home Access: Stairs to enter   Home Layout: Two level;Bed/bath upstairs Home Equipment: Environmental consultant - 2 wheels;Bedside commode Additional Comments: pt going to her son's home at d/c for 1 wk; son's home is one level with ??1 small step to enter     Prior Function Level of Independence: Independent          PT Goals (current goals can now be found in the care plan section) Acute Rehab PT Goals Patient Stated Goal: less pain PT Goal Formulation: With patient Time For Goal Achievement: 01/19/19 Potential to Achieve Goals: Good Progress towards PT  goals: Progressing toward goals    Frequency    7X/week      PT Plan Current plan remains appropriate    Co-evaluation              AM-PAC PT "6 Clicks" Mobility   Outcome Measure  Help needed turning from your back to your side while in a flat bed without using bedrails?: A Little Help needed moving from lying on your back to sitting on the side of a flat bed without using bedrails?: A Little Help needed moving to and from a bed to a chair (including a wheelchair)?: A  Little Help needed standing up from a chair using your arms (e.g., wheelchair or bedside chair)?: A Little Help needed to walk in hospital room?: A Little Help needed climbing 3-5 steps with a railing? : A Lot 6 Click Score: 17    End of Session Equipment Utilized During Treatment: Gait belt Activity Tolerance: Patient tolerated treatment well Patient left: in bed;with call bell/phone within reach;with bed alarm set Nurse Communication: Mobility status PT Visit Diagnosis: Difficulty in walking, not elsewhere classified (R26.2)     Time: 6629-4765 PT Time Calculation (min) (ACUTE ONLY): 14 min  Charges:  $Gait Training: 8-22 mins                     Kenyon Ana, PT  Pager: [phone] Acute Rehab Dept [facility]): [phone]   01/12/2019    [facility] 01/12/2019, 2:15 PM

## 2019-01-12 NOTE — Evaluation (Signed)
Physical Therapy Evaluation Patient Details Name: Mary Hayes MRN: [id number] DOB: 02/04/1941 Today's Date: 01/12/2019   History of Present Illness  s/p L TKA  Clinical Impression  Pt is s/p TKA resulting in the deficits listed below (see PT Problem List). Pt limited by pain, able to amb in hallway but limited d/t pain;RN notified.  Pt will benefit from skilled PT to increase their independence and safety with mobility to allow discharge to the venue listed below.      Follow Up Recommendations Follow surgeon's recommendation for DC plan and follow-up therapies    Equipment Recommendations  None recommended by PT    Recommendations for Other Services       Precautions / Restrictions Precautions Precautions: Fall;Knee Required Braces or Orthoses: Knee Immobilizer - Left Knee Immobilizer - Left: Discontinue once straight leg raise with < 10 degree lag Restrictions Weight Bearing Restrictions: No Other Position/Activity Restrictions: WBAT      Mobility  Bed Mobility               General bed mobility comments: in chair on arrival  Transfers Overall transfer level: Needs assistance Equipment used: Rolling walker (2 wheeled) Transfers: Sit to/from Stand Sit to Stand: Min assist         General transfer comment: multi-modal cues for hand placement and LLE management  Ambulation/Gait Ambulation/Gait assistance: Min assist Gait Distance (Feet): 25 Feet Assistive device: Rolling walker (2 wheeled) Gait Pattern/deviations: Step-to pattern;Antalgic     General Gait Details: cues for sequence and safety, distance limited by pain  Stairs            Wheelchair Mobility    Modified Rankin (Stroke Patients Only)       Balance                                             Pertinent Vitals/Pain Pain Assessment: 0-10 Pain Score: 8  Pain Location: left knee Pain Descriptors / Indicators: Constant;Grimacing;Guarding;Spasm Pain  Intervention(s): Limited activity within patient's tolerance;Monitored during session    Home Living Family/patient expects to be discharged to:: Private residence Living Arrangements: Alone   Type of Home: House Home Access: Stairs to enter   Technical brewer of Steps: 1 Home Layout: Two level;Bed/bath upstairs Home Equipment: Walker - 2 wheels;Bedside commode Additional Comments: pt going to her son's home at d/c for 1 wk; son's home is one level with ??1 small step to enter     Prior Function Level of Independence: Independent               Hand Dominance        Extremity/Trunk Assessment   Upper Extremity Assessment Upper Extremity Assessment: Overall WFL for tasks assessed    Lower Extremity Assessment Lower Extremity Assessment: LLE deficits/detail LLE Deficits / Details: ankle WFL; knee extension and hip flexion 3/5, testing limited by post op pain       Communication   Communication: Prefers language other than English  Cognition Arousal/Alertness: Awake/alert Behavior During Therapy: WFL for tasks assessed/performed Overall Cognitive Status: Within Functional Limits for tasks assessed                                        General Comments      Exercises Total Joint  Exercises Ankle Circles/Pumps: AROM;Both;10 reps   Assessment/Plan    PT Assessment Patient needs continued PT services  PT Problem List Decreased range of motion;Decreased activity tolerance;Decreased mobility;Decreased knowledge of use of DME;Decreased strength;Pain       PT Treatment Interventions DME instruction;Gait training;Stair training;Therapeutic exercise;Therapeutic activities;Functional mobility training;Patient/family education    PT Goals (Current goals can be found in the Care Plan section)  Acute Rehab PT Goals Patient Stated Goal: less pain PT Goal Formulation: With patient Time For Goal Achievement: 01/19/19 Potential to Achieve Goals:  Good    Frequency 7X/week   Barriers to discharge        Co-evaluation               AM-PAC PT "6 Clicks" Mobility  Outcome Measure Help needed turning from your back to your side while in a flat bed without using bedrails?: A Little Help needed moving from lying on your back to sitting on the side of a flat bed without using bedrails?: A Little Help needed moving to and from a bed to a chair (including a wheelchair)?: A Little Help needed standing up from a chair using your arms (e.g., wheelchair or bedside chair)?: A Little Help needed to walk in hospital room?: A Little Help needed climbing 3-5 steps with a railing? : A Lot 6 Click Score: 17    End of Session Equipment Utilized During Treatment: Gait belt Activity Tolerance: Patient tolerated treatment well Patient left: with call bell/phone within reach;in chair(in chair on arrival) Nurse Communication: Mobility status PT Visit Diagnosis: Difficulty in walking, not elsewhere classified (R26.2)    Time: 1030-1051 PT Time Calculation (min) (ACUTE ONLY): 21 min   Charges:   PT Evaluation $PT Eval Low Complexity: 1 Low          Kenyon Ana, PT  Pager: [phone] Acute Rehab Dept [facility]): [phone]   01/12/2019   [facility] 01/12/2019, 11:02 AM

## 2019-01-13 LAB — CBC
HCT: 30.1 % — ABNORMAL LOW (ref 36.0–46.0)
Hemoglobin: 9.6 g/dL — ABNORMAL LOW (ref 12.0–15.0)
MCH: 28.7 pg (ref 26.0–34.0)
MCHC: 31.9 g/dL (ref 30.0–36.0)
MCV: 90.1 fL (ref 80.0–100.0)
Platelets: 188 10*3/uL (ref 150–400)
RBC: 3.34 MIL/uL — AB (ref 3.87–5.11)
RDW: 13.3 % (ref 11.5–15.5)
WBC: 12.4 10*3/uL — ABNORMAL HIGH (ref 4.0–10.5)
nRBC: 0 % (ref 0.0–0.2)

## 2019-01-13 LAB — BASIC METABOLIC PANEL
Anion gap: 7 (ref 5–15)
BUN: 14 mg/dL (ref 8–23)
CO2: 26 mmol/L (ref 22–32)
Calcium: 8.2 mg/dL — ABNORMAL LOW (ref 8.9–10.3)
Chloride: 104 mmol/L (ref 98–111)
Creatinine, Ser: 0.7 mg/dL (ref 0.44–1.00)
GFR calc Af Amer: >60 mL/min (ref >60)
GFR calc non Af Amer: >60 mL/min (ref >60)
Glucose, Bld: 128 mg/dL — ABNORMAL HIGH (ref 70–99)
POTASSIUM: 4.1 mmol/L (ref 3.5–5.1)
Sodium: 137 mmol/L (ref 135–145)

## 2019-01-13 MED ORDER — TRAMADOL HCL 50 MG PO TABS: 50.0000 mg | ORAL_TABLET | Freq: Four times a day (QID) | ORAL | 0 refills | Status: DC | PRN

## 2019-01-13 MED ORDER — OXYCODONE HCL 5 MG PO TABS: 5.0000 mg | ORAL_TABLET | Freq: Four times a day (QID) | ORAL | 0 refills | Status: DC | PRN

## 2019-01-13 MED ORDER — ASPIRIN 325 MG PO TBEC: 325.0000 mg | DELAYED_RELEASE_TABLET | Freq: Two times a day (BID) | ORAL | 0 refills | Status: AC

## 2019-01-13 MED ORDER — METHOCARBAMOL 500 MG PO TABS: 500.0000 mg | ORAL_TABLET | Freq: Four times a day (QID) | ORAL | 0 refills | Status: DC | PRN

## 2019-01-13 NOTE — Discharge Summary (Signed)
Physician Discharge Summary   Patient ID: Mary Hayes MRN: [id number] DOB/AGE: 03/16/1941 78 y.o.  Admit date: 01/11/2019 Discharge date: 01/13/2019  Primary Diagnosis: Osteoarthritis, left knee   Admission Diagnoses:  Past Medical History:  Diagnosis Date  . Arthritis   . Hypercholesteremia   . Hypertension   . Hypothyroidism   . Sleep apnea    cpap   Discharge Diagnoses:   Principal Problem:   OA (osteoarthritis) of knee  Estimated body mass index is 38.08 kg/m as calculated from the following:   Height as of this encounter: 5' (1.524 m).   Weight as of this encounter: 88.5 kg.  Procedure:  Procedure(s) (LRB): TOTAL KNEE ARTHROPLASTY (Left)   Consults: None  HPI: Mary Hayes is a 78 y.o. year old female with end stage OA of her left knee with progressively worsening pain and dysfunction. She has constant pain, with activity and at rest and significant functional deficits with difficulties even with ADLs. She has had extensive non-op management including analgesics, injections of cortisone and viscosupplements, and home exercise program, but remains in significant pain with significant dysfunction. Radiographs show bone on bone arthritis all 3 compartments. She presents now for left Total Knee Arthroplasty.    Laboratory Data: Admission on 01/11/2019, Discharged on 01/13/2019  Component Date Value Ref Range Status  . WBC 01/12/2019 13.5* 4.0 - 10.5 K/uL Final  . RBC 01/12/2019 3.71* 3.87 - 5.11 MIL/uL Final  . Hemoglobin 01/12/2019 10.6* 12.0 - 15.0 g/dL Final  . HCT 01/12/2019 33.6* 36.0 - 46.0 % Final  . MCV 01/12/2019 90.6  80.0 - 100.0 fL Final  . MCH 01/12/2019 28.6  26.0 - 34.0 pg Final  . MCHC 01/12/2019 31.5  30.0 - 36.0 g/dL Final  . RDW 01/12/2019 13.2  11.5 - 15.5 % Final  . Platelets 01/12/2019 196  150 - 400 K/uL Final  . nRBC 01/12/2019 0.0  0.0 - 0.2 % Final   Performed at [facility], [place] [address], [place], [place]  [address]  . Sodium 01/12/2019 136  135 - 145 mmol/L Final  . Potassium 01/12/2019 4.0  3.5 - 5.1 mmol/L Final  . Chloride 01/12/2019 104  98 - 111 mmol/L Final  . CO2 01/12/2019 23  22 - 32 mmol/L Final  . Glucose, Bld 01/12/2019 146* 70 - 99 mg/dL Final  . BUN 01/12/2019 14  8 - 23 mg/dL Final  . Creatinine, Ser 01/12/2019 0.65  0.44 - 1.00 mg/dL Final  . Calcium 01/12/2019 8.2* 8.9 - 10.3 mg/dL Final  . GFR calc non Af Amer 01/12/2019 >60  >60 mL/min Final  . GFR calc Af Amer 01/12/2019 >60  >60 mL/min Final  . Anion gap 01/12/2019 9  5 - 15 Final   Performed at [facility], [place] [address] [place], [place] [address]  . WBC 01/13/2019 12.4* 4.0 - 10.5 K/uL Final  . RBC 01/13/2019 3.34* 3.87 - 5.11 MIL/uL Final  . Hemoglobin 01/13/2019 9.6* 12.0 - 15.0 g/dL Final  . HCT 01/13/2019 30.1* 36.0 - 46.0 % Final  . MCV 01/13/2019 90.1  80.0 - 100.0 fL Final  . MCH 01/13/2019 28.7  26.0 - 34.0 pg Final  . MCHC 01/13/2019 31.9  30.0 - 36.0 g/dL Final  . RDW 01/13/2019 13.3  11.5 - 15.5 % Final  . Platelets 01/13/2019 188  150 - 400 K/uL Final  . nRBC 01/13/2019 0.0  0.0 - 0.2 % Final   Performed at [facility], [place] [address], [place], [place] [address]  . Sodium 01/13/2019 137  135 - 145 mmol/L Final  . Potassium 01/13/2019 4.1  3.5 - 5.1 mmol/L Final  . Chloride 01/13/2019 104  98 - 111 mmol/L Final  . CO2 01/13/2019 26  22 - 32 mmol/L Final  . Glucose, Bld 01/13/2019 128* 70 - 99 mg/dL Final  . BUN 01/13/2019 14  8 - 23 mg/dL Final  . Creatinine, Ser 01/13/2019 0.70  0.44 - 1.00 mg/dL Final  . Calcium 01/13/2019 8.2* 8.9 - 10.3 mg/dL Final  . GFR calc non Af Amer 01/13/2019 >60  >60 mL/min Final  . GFR calc Af Amer 01/13/2019 >60  >60 mL/min Final  . Anion gap 01/13/2019 7  5 - 15 Final   Performed at [facility], [place] [address] [place], [place] [address]  Hospital Outpatient Visit on 01/06/2019  Component Date Value Ref Range  Status  . aPTT 01/06/2019 28  24 - 36 seconds Final   Performed at [facility], [place] [address], [place], [place] [address]  . WBC 01/06/2019 7.5  4.0 - 10.5 K/uL Final  . RBC 01/06/2019 4.34  3.87 - 5.11 MIL/uL Final  . Hemoglobin 01/06/2019 12.6  12.0 - 15.0 g/dL Final  . HCT 01/06/2019 39.3  36.0 - 46.0 % Final  . MCV 01/06/2019 90.6  80.0 - 100.0 fL Final  . MCH 01/06/2019 29.0  26.0 - 34.0 pg Final  . MCHC 01/06/2019 32.1  30.0 - 36.0 g/dL Final  . RDW 01/06/2019 13.2  11.5 - 15.5 % Final  . Platelets 01/06/2019 233  150 - 400 K/uL Final  . nRBC 01/06/2019 0.0  0.0 - 0.2 % Final   Performed at [facility], [place] [address] [place], [place] [address]  . Sodium 01/06/2019 139  135 - 145 mmol/L Final  . Potassium 01/06/2019 4.4  3.5 - 5.1 mmol/L Final  . Chloride 01/06/2019 107  98 - 111 mmol/L Final  . CO2 01/06/2019 23  22 - 32 mmol/L Final  . Glucose, Bld 01/06/2019 115* 70 - 99 mg/dL Final  . BUN 01/06/2019 17  8 - 23 mg/dL Final  . Creatinine, Ser 01/06/2019 0.71  0.44 - 1.00 mg/dL Final  . Calcium 01/06/2019 8.6* 8.9 - 10.3 mg/dL Final  . Total Protein 01/06/2019 6.7  6.5 - 8.1 g/dL Final  . Albumin 01/06/2019 3.9  3.5 - 5.0 g/dL Final  . AST 01/06/2019 34  15 - 41 U/L Final  . ALT 01/06/2019 31  0 - 44 U/L Final  . Alkaline Phosphatase 01/06/2019 32* 38 - 126 U/L Final  . Total Bilirubin 01/06/2019 0.6  0.3 - 1.2 mg/dL Final  . GFR calc non Af Amer 01/06/2019 >60  >60 mL/min Final  . GFR calc Af Amer 01/06/2019 >60  >60 mL/min Final  . Anion gap 01/06/2019 9  5 - 15 Final   Performed at [facility], [place] [address], [place], [place] [address]  . Prothrombin Time 01/06/2019 13.3  11.4 - 15.2 seconds Final  . INR 01/06/2019 1.01   Final   Performed at [facility], [place] [address] [place], [place] [address]  . ABO/RH(D) 01/06/2019 A NEG   Final  . Antibody Screen 01/06/2019 NEG   Final  . Sample  Expiration 01/06/2019 01/14/2019   Final  . Extend sample reason 01/06/2019    Final  Value:NO TRANSFUSIONS OR PREGNANCY IN THE PAST 3 MONTHS Performed at [place] [address], [place], [place] [address]   . MRSA, PCR 01/06/2019 NEGATIVE  NEGATIVE Final  . Staphylococcus aureus 01/06/2019 NEGATIVE  NEGATIVE Final   Comment: (NOTE) The Xpert SA Assay (FDA approved for NASAL specimens in patients 47 years of age and older), is one component of a comprehensive surveillance program. It is not intended to diagnose infection nor to guide or monitor treatment. Performed at [facility], [place] [address], [place], [place] [address]      X-Rays:No results found.  EKG:No orders found for this or any previous visit.   Hospital Course: Mary Hayes is a 78 y.o. who was admitted to [facility]. They were brought to the operating room on 01/11/2019 and underwent Procedure(s): TOTAL KNEE ARTHROPLASTY.  Patient tolerated the procedure well and was later transferred to the recovery room and then to the orthopaedic floor for postoperative care. They were given PO and IV analgesics for pain control following their surgery. They were given 24 hours of postoperative antibiotics of  Anti-infectives (From admission, onward)   Start     Dose/Rate Route Frequency Ordered Stop   01/11/19 2030  ceFAZolin (ANCEF) IVPB 2g/100 mL premix     2 g 200 mL/hr over 30 Minutes Intravenous Every 6 hours 01/11/19 1741 01/12/19 0240   01/11/19 1130  ceFAZolin (ANCEF) IVPB 2g/100 mL premix     2 g 200 mL/hr over 30 Minutes Intravenous On call to O.R. 01/11/19 1116 01/11/19 1456     and started on DVT prophylaxis in the form of Aspirin.   PT and OT were ordered for total joint protocol. Discharge planning consulted to help with postop disposition and equipment needs. Patient had a good night on the evening of surgery. They started to get up OOB with  therapy on POD #0. Hemovac drain was pulled without difficulty on day one. Continued to work with therapy into POD #2. Pt was seen during rounds on day two and was ready to go home pending progress with therapy. Dressing was changed and the incision was clean, dry, and intact with minimal bloody drainage at the distal incision. Pt worked with therapy for one additional session and was meeting their goals. She was discharged to home later that day in stable condition.  Diet: Regular diet Activity: WBAT Follow-up: in 2 weeks Disposition: Home with outpatient physical therapy at [facility] Discharged Condition: stable   Discharge Instructions    Call MD / Call 911   Complete by:  As directed    If you experience chest pain or shortness of breath, CALL 911 and be transported to the hospital emergency room.  If you develope a fever above 101 F, pus (white drainage) or increased drainage or redness at the wound, or calf pain, call your surgeon's office.   Change dressing   Complete by:  As directed    Change the dressing daily with sterile 4 x 4 inch gauze dressing and apply TED hose.   Constipation Prevention   Complete by:  As directed    Drink plenty of fluids.  Prune juice may be helpful.  You may use a stool softener, such as Colace (over the counter) 100 mg twice a day.  Use MiraLax (over the counter) for constipation as needed.   Diet - low sodium heart healthy   Complete by:  As directed    Discharge instructions   Complete  by:  As directed    Dr. Gaynelle Arabian Total Joint Specialist [facility] [address], [place], [place] [address] [phone]  TOTAL KNEE REPLACEMENT POSTOPERATIVE DIRECTIONS  Knee Rehabilitation, Guidelines Following Surgery  Results after knee surgery are often greatly improved when you follow the exercise, range of motion and muscle strengthening exercises prescribed by your doctor. Safety measures are also important to protect the knee from  further injury. Any time any of these exercises cause you to have increased pain or swelling in your knee joint, decrease the amount until you are comfortable again and slowly increase them. If you have problems or questions, call your caregiver or physical therapist for advice.   HOME CARE INSTRUCTIONS  Remove items at home which could result in a fall. This includes throw rugs or furniture in walking pathways.  ICE to the affected knee every three hours for 30 minutes at a time and then as needed for pain and swelling.  Continue to use ice on the knee for pain and swelling from surgery. You may notice swelling that will progress down to the foot and ankle.  This is normal after surgery.  Elevate the leg when you are not up walking on it.   Continue to use the breathing machine which will help keep your temperature down.  It is common for your temperature to cycle up and down following surgery, especially at night when you are not up moving around and exerting yourself.  The breathing machine keeps your lungs expanded and your temperature down. Do not place pillow under knee, focus on keeping the knee straight while resting   DIET You may resume your previous home diet once your are discharged from the hospital.  DRESSING / WOUND CARE / SHOWERING You may shower 3 days after surgery, but keep the wounds dry during showering.  You may use an occlusive plastic wrap (Press'n Seal for example), NO SOAKING/SUBMERGING IN THE BATHTUB.  If the bandage gets wet, change with a clean dry gauze.  If the incision gets wet, pat the wound dry with a clean towel. You may start showering once you are discharged home but do not submerge the incision under water. Just pat the incision dry and apply a dry gauze dressing on daily. Change the surgical dressing daily and reapply a dry dressing each time.  ACTIVITY Walk with your walker as instructed. Use walker as long as suggested by your caregivers. Avoid periods of  inactivity such as sitting longer than an hour when not asleep. This helps prevent blood clots.  You may resume a sexual relationship in one month or when given the OK by your doctor.  You may return to work once you are cleared by your doctor.  Do not drive a car for 6 weeks or until released by you surgeon.  Do not drive while taking narcotics.  WEIGHT BEARING Weight bearing as tolerated with assist device (walker, cane, etc) as directed, use it as long as suggested by your surgeon or therapist, typically at least 4-6 weeks.  POSTOPERATIVE CONSTIPATION PROTOCOL Constipation - defined medically as fewer than three stools per week and severe constipation as less than one stool per week.  One of the most common issues patients have following surgery is constipation.  Even if you have a regular bowel pattern at home, your normal regimen is likely to be disrupted due to multiple reasons following surgery.  Combination of anesthesia, postoperative narcotics, change in appetite and fluid intake all  can affect your bowels.  In order to avoid complications following surgery, here are some recommendations in order to help you during your recovery period.  Colace (docusate) - Pick up an over-the-counter form of Colace or another stool softener and take twice a day as long as you are requiring postoperative pain medications.  Take with a full glass of water daily.  If you experience loose stools or diarrhea, hold the colace until you stool forms back up.  If your symptoms do not get better within 1 week or if they get worse, check with your doctor.  Dulcolax (bisacodyl) - Pick up over-the-counter and take as directed by the product packaging as needed to assist with the movement of your bowels.  Take with a full glass of water.  Use this product as needed if not relieved by Colace only.   MiraLax (polyethylene glycol) - Pick up over-the-counter to have on hand.  MiraLax is a solution that will increase the  amount of water in your bowels to assist with bowel movements.  Take as directed and can mix with a glass of water, juice, soda, coffee, or tea.  Take if you go more than two days without a movement. Do not use MiraLax more than once per day. Call your doctor if you are still constipated or irregular after using this medication for 7 days in a row.  If you continue to have problems with postoperative constipation, please contact the office for further assistance and recommendations.  If you experience "the worst abdominal pain ever" or develop nausea or vomiting, please contact the office immediatly for further recommendations for treatment.  ITCHING  If you experience itching with your medications, try taking only a single pain pill, or even half a pain pill at a time.  You can also use Benadryl over the counter for itching or also to help with sleep.   TED HOSE STOCKINGS Wear the elastic stockings on both legs for three weeks following surgery during the day but you may remove then at night for sleeping.  MEDICATIONS See your medication summary on the "After Visit Summary" that the nursing staff will review with you prior to discharge.  You may have some home medications which will be placed on hold until you complete the course of blood thinner medication.  It is important for you to complete the blood thinner medication as prescribed by your surgeon.  Continue your approved medications as instructed at time of discharge.  PRECAUTIONS If you experience chest pain or shortness of breath - call 911 immediately for transfer to the hospital emergency department.  If you develop a fever greater that 101 F, purulent drainage from wound, increased redness or drainage from wound, foul odor from the wound/dressing, or calf pain - CONTACT YOUR SURGEON.                                                   FOLLOW-UP APPOINTMENTS Make sure you keep all of your appointments after your operation with your  surgeon and caregivers. You should call the office at the above phone number and make an appointment for approximately two weeks after the date of your surgery or on the date instructed by your surgeon outlined in the "After Visit Summary".   RANGE OF MOTION AND STRENGTHENING EXERCISES  Rehabilitation of the knee is important  following a knee injury or an operation. After just a few days of immobilization, the muscles of the thigh which control the knee become weakened and shrink (atrophy). Knee exercises are designed to build up the tone and strength of the thigh muscles and to improve knee motion. Often times heat used for twenty to thirty minutes before working out will loosen up your tissues and help with improving the range of motion but do not use heat for the first two weeks following surgery. These exercises can be done on a training (exercise) mat, on the floor, on a table or on a bed. Use what ever works the best and is most comfortable for you Knee exercises include:  Leg Lifts - While your knee is still immobilized in a splint or cast, you can do straight leg raises. Lift the leg to 60 degrees, hold for 3 sec, and slowly lower the leg. Repeat 10-20 times 2-3 times daily. Perform this exercise against resistance later as your knee gets better.  Quad and Hamstring Sets - Tighten up the muscle on the front of the thigh (Quad) and hold for 5-10 sec. Repeat this 10-20 times hourly. Hamstring sets are done by pushing the foot backward against an object and holding for 5-10 sec. Repeat as with quad sets.  Leg Slides: Lying on your back, slowly slide your foot toward your buttocks, bending your knee up off the floor (only go as far as is comfortable). Then slowly slide your foot back down until your leg is flat on the floor again. Angel Wings: Lying on your back spread your legs to the side as far apart as you can without causing discomfort.  A rehabilitation program following serious knee injuries can  speed recovery and prevent re-injury in the future due to weakened muscles. Contact your doctor or a physical therapist for more information on knee rehabilitation.   IF YOU ARE TRANSFERRED TO A SKILLED REHAB FACILITY If the patient is transferred to a skilled rehab facility following release from the hospital, a list of the current medications will be sent to the facility for the patient to continue.  When discharged from the skilled rehab facility, please have the facility set up the patient's [place] prior to being released. Also, the skilled facility will be responsible for providing the patient with their medications at time of release from the facility to include their pain medication, the muscle relaxants, and their blood thinner medication. If the patient is still at the rehab facility at time of the two week follow up appointment, the skilled rehab facility will also need to assist the patient in arranging follow up appointment in our office and any transportation needs.  MAKE SURE YOU:  Understand these instructions.  Get help right away if you are not doing well or get worse.    Pick up stool softner and laxative for home use following surgery while on pain medications. Do not submerge incision under water. Please use good hand washing techniques while changing dressing each day. May shower starting three days after surgery. Please use a clean towel to pat the incision dry following showers. Continue to use ice for pain and swelling after surgery. Do not use any lotions or creams on the incision until instructed by your surgeon.   Do not put a pillow under the knee. Place it under the heel.   Complete by:  As directed    Driving restrictions   Complete by:  As directed  No driving for two weeks   TED hose   Complete by:  As directed    Use stockings (TED hose) for three weeks on both leg(s).  You may remove them at night for sleeping.   Weight bearing as  tolerated   Complete by:  As directed      Allergies as of 01/13/2019      Reactions   Lisinopril Other (See Comments)   imflamation of lips      Medication List    STOP taking these medications   methylPREDNISolone 4 MG Tbpk tablet Commonly known as:  MEDROL DOSEPAK   naproxen 375 MG tablet Commonly known as:  NAPROSYN     TAKE these medications   acetaminophen 650 MG CR tablet Commonly known as:  TYLENOL Take 650 mg by mouth every 8 (eight) hours as needed for pain.   aspirin 325 MG EC tablet Take 1 tablet (325 mg total) by mouth 2 (two) times daily for 19 days. Then take one 81 mg aspirin once a day for three weeks. Then discontinue aspirin.   atorvastatin 10 MG tablet Commonly known as:  LIPITOR Take 10 mg by mouth daily.   cholecalciferol 25 MCG (1000 UT) tablet Commonly known as:  VITAMIN D3 Take 1,000 Units by mouth daily.   hydrocortisone cream 1 % Apply 1 application topically 3 (three) times daily as needed for itching.   levothyroxine 25 MCG tablet Commonly known as:  SYNTHROID, LEVOTHROID Take 25 mcg by mouth daily.   losartan 50 MG tablet Commonly known as:  COZAAR Take 50 mg by mouth daily.   methocarbamol 500 MG tablet Commonly known as:  ROBAXIN Take 1 tablet (500 mg total) by mouth every 6 (six) hours as needed for muscle spasms. What changed:    when to take this  reasons to take this   multivitamin with minerals Tabs tablet Take 1 tablet by mouth daily.   oxyCODONE 5 MG immediate release tablet Commonly known as:  Oxy IR/ROXICODONE Take 1-2 tablets (5-10 mg total) by mouth every 6 (six) hours as needed for moderate pain or severe pain.   polyvinyl alcohol 1.4 % ophthalmic solution Commonly known as:  LIQUIFILM TEARS Place 1 drop into both eyes as needed for dry eyes.   psyllium 95 % Pack Commonly known as:  HYDROCIL/METAMUCIL Take 1 packet by mouth daily.   raloxifene 60 MG tablet Commonly known as:  EVISTA Take 60 mg by mouth  daily.   sodium chloride 0.65 % Soln nasal spray Commonly known as:  OCEAN Place 1 spray into both nostrils as needed for congestion.   traMADol 50 MG tablet Commonly known as:  ULTRAM Take 1-2 tablets (50-100 mg total) by mouth every 6 (six) hours as needed for moderate pain (if oxycodone alone insufficient).            Discharge Care Instructions  (From admission, onward)         Start     Ordered   01/12/19 0000  Weight bearing as tolerated     01/12/19 0720   01/12/19 0000  Change dressing    Comments:  Change the dressing daily with sterile 4 x 4 inch gauze dressing and apply TED hose.   01/12/19 0720         Follow-up Information    Gaynelle Arabian, MD. Schedule an appointment as soon as possible for a visit on 01/26/2019.   Specialty:  Orthopedic Surgery Contact information: [address]  [place] [address] [phone]           Signed: Theresa Duty, PA-C Orthopedic Surgery 01/13/2019, 11:28 AM

## 2019-01-13 NOTE — Progress Notes (Signed)
Physical Therapy Treatment Patient Details Name: Mary Hayes MRN: [id number] DOB: 1941/05/28 Today's Date: 01/13/2019    History of Present Illness s/p L TKA    PT Comments    Pt ambulated in hallway and performed a few steps.  Pt also performed LE exercises and provided with HEP.  Pt feels ready for d/c home today.  All pt and son's questions answered within scope of practice.   Follow Up Recommendations  Follow surgeon's recommendation for DC plan and follow-up therapies     Equipment Recommendations  None recommended by PT    Recommendations for Other Services       Precautions / Restrictions Precautions Precautions: Fall;Knee Restrictions Other Position/Activity Restrictions: WBAT    Mobility  Bed Mobility               General bed mobility comments: pt up in recliner on arrival  Transfers Overall transfer level: Needs assistance Equipment used: Rolling walker (2 wheeled) Transfers: Sit to/from Stand Sit to Stand: Min guard         General transfer comment: increased time but able to recall safe technique  Ambulation/Gait Ambulation/Gait assistance: Min guard Gait Distance (Feet): 120 Feet Assistive device: Rolling walker (2 wheeled) Gait Pattern/deviations: Step-to pattern;Antalgic Gait velocity: decr   General Gait Details: cues for sequence and RW positioning   Stairs Stairs: Yes Stairs assistance: Min guard Stair Management: Step to pattern;Forwards;Two rails Number of Stairs: 3 General stair comments: verbal cues for sequence and safety, pt wished to practice a couple steps however she will d/c home to son's house and has no immediate steps   Wheelchair Mobility    Modified Rankin (Stroke Patients Only)       Balance                                            Cognition Arousal/Alertness: Awake/alert Behavior During Therapy: WFL for tasks assessed/performed Overall Cognitive Status: Within Functional  Limits for tasks assessed                                        Exercises Total Joint Exercises Ankle Circles/Pumps: AROM;Both;10 reps Quad Sets: AROM;Both;10 reps Short Arc Quad: AROM;10 reps;Left Heel Slides: AAROM;Left;10 reps Hip ABduction/ADduction: AROM;Left;10 reps Straight Leg Raises: AROM;Left;10 reps    General Comments        Pertinent Vitals/Pain Pain Score: 4  Pain Location: left knee Pain Descriptors / Indicators: Sore;Aching Pain Intervention(s): Limited activity within patient's tolerance;Monitored during session;Premedicated before session;Repositioned;Ice applied    Home Living                      Prior Function            PT Goals (current goals can now be found in the care plan section) Progress towards PT goals: Progressing toward goals    Frequency    7X/week      PT Plan Current plan remains appropriate    Co-evaluation              AM-PAC PT "6 Clicks" Mobility   Outcome Measure  Help needed turning from your back to your side while in a flat bed without using bedrails?: A Little Help needed moving from lying on your back to  sitting on the side of a flat bed without using bedrails?: A Little Help needed moving to and from a bed to a chair (including a wheelchair)?: A Little Help needed standing up from a chair using your arms (e.g., wheelchair or bedside chair)?: A Little Help needed to walk in hospital room?: A Little Help needed climbing 3-5 steps with a railing? : A Little 6 Click Score: 18    End of Session   Activity Tolerance: Patient tolerated treatment well Patient left: in chair;with family/visitor present Nurse Communication: Mobility status PT Visit Diagnosis: Difficulty in walking, not elsewhere classified (R26.2)     Time: 9381-8299 PT Time Calculation (min) (ACUTE ONLY): 17 min  Charges:  $Gait Training: 8-22 mins                     Carmelia Bake, PT, [place] Office: [phone] Pager: [phone]  Trena Platt 01/13/2019, 2:04 PM

## 2019-01-13 NOTE — Progress Notes (Signed)
   Subjective: 2 Days Post-Op Procedure(s) (LRB): TOTAL KNEE ARTHROPLASTY (Left) Patient reports pain as mild.   Patient seen in rounds with Dr. Wynelle Link. Patient is well, and has had no acute complaints or problems. States she is ready to go home. Pain well controlled with medications. No issues overnight. Voiding without difficulty and positive flatus. Denies chest pain or SOB. Plan is to go Home after hospital stay.  Objective: Vital signs in last 24 hours: Temp:  [98.4 F (36.9 C)-98.6 F (37 C)] 98.5 F (36.9 C) (01/22 0506) Pulse Rate:  [69-77] 77 (01/22 0506) Resp:  [16-18] 17 (01/22 0506) BP: (110-127)/(51-60) 127/51 (01/22 0506) SpO2:  [93 %-98 %] 93 % (01/22 0506) Weight:  [88.5 kg] 88.5 kg (01/21 1702)  Intake/Output from previous day:  Intake/Output Summary (Last 24 hours) at 01/13/2019 0721 Last data filed at 01/13/2019 0512 Gross per 24 hour  Intake 840.78 ml  Output 4150 ml  Net -3309.22 ml    Labs: Recent Labs    01/12/19 0440 01/13/19 0359  HGB 10.6* 9.6*   Recent Labs    01/12/19 0440 01/13/19 0359  WBC 13.5* 12.4*  RBC 3.71* 3.34*  HCT 33.6* 30.1*  PLT 196 188   Recent Labs    01/12/19 0440 01/13/19 0359  NA 136 137  K 4.0 4.1  CL 104 104  CO2 23 26  BUN 14 14  CREATININE 0.65 0.70  GLUCOSE 146* 128*  CALCIUM 8.2* 8.2*   Exam: General - Patient is Alert and Oriented Extremity - Neurologically intact Neurovascular intact Sensation intact distally Dorsiflexion/Plantar flexion intact Dressing/Incision - clean, dry, with minimal bloody drainage from distal end of incision Motor Function - intact, moving foot and toes well on exam.   Past Medical History:  Diagnosis Date  . Arthritis   . Hypercholesteremia   . Hypertension   . Hypothyroidism   . Sleep apnea    cpap    Assessment/Plan: 2 Days Post-Op Procedure(s) (LRB): TOTAL KNEE ARTHROPLASTY (Left) Principal Problem:   OA (osteoarthritis) of knee  Estimated body mass  index is 38.08 kg/m as calculated from the following:   Height as of this encounter: 5' (1.524 m).   Weight as of this encounter: 88.5 kg. Up with therapy D/C IV fluids  DVT Prophylaxis - Aspirin Weight-bearing as tolerated  Plan for discharge this afternoon if meeting goals with therapy. Scheduled for outpatient PT at [facility] on Friday. Follow-up in the office in 2 weeks with Dr. Wynelle Link.  Theresa Duty, PA-C Orthopedic Surgery 01/13/2019, 7:21 AM

## 2019-01-13 NOTE — Care Management Note (Signed)
Case Management Note  Patient Details  Name: Mary Hayes MRN: [id number] Date of Birth: 08/17/1941  Subjective/Objective:    Spoke with patient at bedside. Confirmed plan for OP PT, already arranged. Has RW and 3n1. [phone]                Action/Plan:   Expected Discharge Date:  01/13/19               Expected Discharge Plan:  OP Rehab  In-House Referral:  NA  Discharge planning Services  CM Consult  Post Acute Care Choice:  NA Choice offered to:  Patient  DME Arranged:  N/A DME Agency:  NA  HH Arranged:  NA HH Agency:  NA  Status of Service:  Completed, signed off  If discussed at [place] of Stay Meetings, dates discussed:    Additional Comments:  Guadalupe Maple, RN 01/13/2019, 9:16 AM

## 2019-01-13 NOTE — Plan of Care (Signed)
  Problem: Education: Goal: Knowledge of the prescribed therapeutic regimen will improve Outcome: Progressing   Problem: Activity: Goal: Ability to avoid complications of mobility impairment will improve Outcome: Progressing   Problem: Clinical Measurements: Goal: Postoperative complications will be avoided or minimized Outcome: Progressing   Problem: Pain Management: Goal: Pain level will decrease with appropriate interventions Outcome: Progressing   Problem: Clinical Measurements: Goal: Will remain free from infection Outcome: Progressing Goal: Respiratory complications will improve Outcome: Progressing Goal: Cardiovascular complication will be avoided Outcome: Progressing   Problem: Pain Managment: Goal: General experience of comfort will improve Outcome: Progressing

## 2019-01-15 DIAGNOSIS — M25562 Pain in left knee: Secondary | ICD-10-CM | POA: Diagnosis not present

## 2019-01-19 DIAGNOSIS — M25562 Pain in left knee: Secondary | ICD-10-CM | POA: Diagnosis not present

## 2019-01-22 DIAGNOSIS — M25562 Pain in left knee: Secondary | ICD-10-CM | POA: Diagnosis not present

## 2019-01-26 DIAGNOSIS — M25562 Pain in left knee: Secondary | ICD-10-CM | POA: Diagnosis not present

## 2019-02-02 DIAGNOSIS — M25562 Pain in left knee: Secondary | ICD-10-CM | POA: Diagnosis not present

## 2019-02-05 DIAGNOSIS — M25562 Pain in left knee: Secondary | ICD-10-CM | POA: Diagnosis not present

## 2019-02-09 DIAGNOSIS — M25562 Pain in left knee: Secondary | ICD-10-CM | POA: Diagnosis not present

## 2019-02-11 DIAGNOSIS — M25562 Pain in left knee: Secondary | ICD-10-CM | POA: Diagnosis not present

## 2019-02-16 DIAGNOSIS — M25562 Pain in left knee: Secondary | ICD-10-CM | POA: Diagnosis not present

## 2019-02-16 DIAGNOSIS — Z96652 Presence of left artificial knee joint: Secondary | ICD-10-CM | POA: Diagnosis not present

## 2019-02-16 DIAGNOSIS — Z471 Aftercare following joint replacement surgery: Secondary | ICD-10-CM | POA: Diagnosis not present

## 2019-02-18 DIAGNOSIS — M25562 Pain in left knee: Secondary | ICD-10-CM | POA: Diagnosis not present

## 2019-02-22 DIAGNOSIS — M25562 Pain in left knee: Secondary | ICD-10-CM | POA: Diagnosis not present

## 2019-02-26 DIAGNOSIS — M25562 Pain in left knee: Secondary | ICD-10-CM | POA: Diagnosis not present

## 2019-03-02 DIAGNOSIS — M25562 Pain in left knee: Secondary | ICD-10-CM | POA: Diagnosis not present

## 2019-03-05 DIAGNOSIS — M25562 Pain in left knee: Secondary | ICD-10-CM | POA: Diagnosis not present

## 2019-03-09 DIAGNOSIS — M25562 Pain in left knee: Secondary | ICD-10-CM | POA: Diagnosis not present

## 2019-03-11 ENCOUNTER — Encounter (HOSPITAL_COMMUNITY): Payer: Self-pay | Admitting: Orthopedic Surgery

## 2019-03-11 DIAGNOSIS — M25562 Pain in left knee: Secondary | ICD-10-CM | POA: Diagnosis not present

## 2019-03-16 DIAGNOSIS — M25562 Pain in left knee: Secondary | ICD-10-CM | POA: Diagnosis not present

## 2019-03-18 DIAGNOSIS — M25562 Pain in left knee: Secondary | ICD-10-CM | POA: Diagnosis not present

## 2019-03-23 DIAGNOSIS — M25562 Pain in left knee: Secondary | ICD-10-CM | POA: Diagnosis not present

## 2019-06-17 DIAGNOSIS — M810 Age-related osteoporosis without current pathological fracture: Secondary | ICD-10-CM | POA: Diagnosis not present

## 2019-06-17 DIAGNOSIS — E782 Mixed hyperlipidemia: Secondary | ICD-10-CM | POA: Diagnosis not present

## 2019-06-17 DIAGNOSIS — E7849 Other hyperlipidemia: Secondary | ICD-10-CM | POA: Diagnosis not present

## 2019-06-17 DIAGNOSIS — E039 Hypothyroidism, unspecified: Secondary | ICD-10-CM | POA: Diagnosis not present

## 2019-06-17 DIAGNOSIS — M1990 Unspecified osteoarthritis, unspecified site: Secondary | ICD-10-CM | POA: Diagnosis not present

## 2019-06-17 DIAGNOSIS — I10 Essential (primary) hypertension: Secondary | ICD-10-CM | POA: Diagnosis not present

## 2019-07-02 DIAGNOSIS — M1990 Unspecified osteoarthritis, unspecified site: Secondary | ICD-10-CM | POA: Diagnosis not present

## 2019-07-02 DIAGNOSIS — Z96652 Presence of left artificial knee joint: Secondary | ICD-10-CM | POA: Diagnosis not present

## 2019-07-02 DIAGNOSIS — Z471 Aftercare following joint replacement surgery: Secondary | ICD-10-CM | POA: Diagnosis not present

## 2019-07-02 DIAGNOSIS — E039 Hypothyroidism, unspecified: Secondary | ICD-10-CM | POA: Diagnosis not present

## 2019-07-02 DIAGNOSIS — R7301 Impaired fasting glucose: Secondary | ICD-10-CM | POA: Diagnosis not present

## 2019-07-02 DIAGNOSIS — G4730 Sleep apnea, unspecified: Secondary | ICD-10-CM | POA: Diagnosis not present

## 2019-07-02 DIAGNOSIS — I10 Essential (primary) hypertension: Secondary | ICD-10-CM | POA: Diagnosis not present

## 2019-07-02 DIAGNOSIS — M810 Age-related osteoporosis without current pathological fracture: Secondary | ICD-10-CM | POA: Diagnosis not present

## 2019-07-02 DIAGNOSIS — E782 Mixed hyperlipidemia: Secondary | ICD-10-CM | POA: Diagnosis not present

## 2019-07-02 DIAGNOSIS — K760 Fatty (change of) liver, not elsewhere classified: Secondary | ICD-10-CM | POA: Diagnosis not present

## 2019-07-09 DIAGNOSIS — M1990 Unspecified osteoarthritis, unspecified site: Secondary | ICD-10-CM | POA: Diagnosis not present

## 2019-07-09 DIAGNOSIS — I10 Essential (primary) hypertension: Secondary | ICD-10-CM | POA: Diagnosis not present

## 2019-07-09 DIAGNOSIS — K760 Fatty (change of) liver, not elsewhere classified: Secondary | ICD-10-CM | POA: Diagnosis not present

## 2019-07-09 DIAGNOSIS — M25562 Pain in left knee: Secondary | ICD-10-CM | POA: Diagnosis not present

## 2019-07-09 DIAGNOSIS — R7301 Impaired fasting glucose: Secondary | ICD-10-CM | POA: Diagnosis not present

## 2019-07-09 DIAGNOSIS — E039 Hypothyroidism, unspecified: Secondary | ICD-10-CM | POA: Diagnosis not present

## 2019-07-09 DIAGNOSIS — E782 Mixed hyperlipidemia: Secondary | ICD-10-CM | POA: Diagnosis not present

## 2019-07-09 DIAGNOSIS — M810 Age-related osteoporosis without current pathological fracture: Secondary | ICD-10-CM | POA: Diagnosis not present

## 2019-07-09 DIAGNOSIS — G4730 Sleep apnea, unspecified: Secondary | ICD-10-CM | POA: Diagnosis not present

## 2019-07-14 DIAGNOSIS — E039 Hypothyroidism, unspecified: Secondary | ICD-10-CM | POA: Diagnosis not present

## 2019-07-14 DIAGNOSIS — I10 Essential (primary) hypertension: Secondary | ICD-10-CM | POA: Diagnosis not present

## 2019-07-14 DIAGNOSIS — M1990 Unspecified osteoarthritis, unspecified site: Secondary | ICD-10-CM | POA: Diagnosis not present

## 2019-07-14 DIAGNOSIS — E782 Mixed hyperlipidemia: Secondary | ICD-10-CM | POA: Diagnosis not present

## 2019-07-14 DIAGNOSIS — M810 Age-related osteoporosis without current pathological fracture: Secondary | ICD-10-CM | POA: Diagnosis not present

## 2019-07-15 DIAGNOSIS — G4733 Obstructive sleep apnea (adult) (pediatric): Secondary | ICD-10-CM | POA: Diagnosis not present

## 2019-08-20 DIAGNOSIS — E039 Hypothyroidism, unspecified: Secondary | ICD-10-CM | POA: Diagnosis not present

## 2019-08-20 DIAGNOSIS — M810 Age-related osteoporosis without current pathological fracture: Secondary | ICD-10-CM | POA: Diagnosis not present

## 2019-08-20 DIAGNOSIS — I10 Essential (primary) hypertension: Secondary | ICD-10-CM | POA: Diagnosis not present

## 2019-08-20 DIAGNOSIS — E782 Mixed hyperlipidemia: Secondary | ICD-10-CM | POA: Diagnosis not present

## 2019-08-20 DIAGNOSIS — M1990 Unspecified osteoarthritis, unspecified site: Secondary | ICD-10-CM | POA: Diagnosis not present

## 2019-09-16 DIAGNOSIS — M5030 Other cervical disc degeneration, unspecified cervical region: Secondary | ICD-10-CM | POA: Diagnosis not present

## 2019-09-16 DIAGNOSIS — M542 Cervicalgia: Secondary | ICD-10-CM | POA: Diagnosis not present

## 2019-09-21 DIAGNOSIS — I10 Essential (primary) hypertension: Secondary | ICD-10-CM | POA: Diagnosis not present

## 2019-09-21 DIAGNOSIS — M1990 Unspecified osteoarthritis, unspecified site: Secondary | ICD-10-CM | POA: Diagnosis not present

## 2019-09-21 DIAGNOSIS — M810 Age-related osteoporosis without current pathological fracture: Secondary | ICD-10-CM | POA: Diagnosis not present

## 2019-09-21 DIAGNOSIS — E7849 Other hyperlipidemia: Secondary | ICD-10-CM | POA: Diagnosis not present

## 2019-09-21 DIAGNOSIS — E039 Hypothyroidism, unspecified: Secondary | ICD-10-CM | POA: Diagnosis not present

## 2019-09-21 DIAGNOSIS — E782 Mixed hyperlipidemia: Secondary | ICD-10-CM | POA: Diagnosis not present

## 2019-09-24 DIAGNOSIS — Z1231 Encounter for screening mammogram for malignant neoplasm of breast: Secondary | ICD-10-CM | POA: Diagnosis not present

## 2019-10-05 DIAGNOSIS — I10 Essential (primary) hypertension: Secondary | ICD-10-CM | POA: Diagnosis not present

## 2019-10-05 DIAGNOSIS — E782 Mixed hyperlipidemia: Secondary | ICD-10-CM | POA: Diagnosis not present

## 2019-10-05 DIAGNOSIS — M810 Age-related osteoporosis without current pathological fracture: Secondary | ICD-10-CM | POA: Diagnosis not present

## 2019-10-05 DIAGNOSIS — M1990 Unspecified osteoarthritis, unspecified site: Secondary | ICD-10-CM | POA: Diagnosis not present

## 2019-10-05 DIAGNOSIS — E039 Hypothyroidism, unspecified: Secondary | ICD-10-CM | POA: Diagnosis not present

## 2019-11-06 DIAGNOSIS — E782 Mixed hyperlipidemia: Secondary | ICD-10-CM | POA: Diagnosis not present

## 2019-11-06 DIAGNOSIS — I10 Essential (primary) hypertension: Secondary | ICD-10-CM | POA: Diagnosis not present

## 2019-11-06 DIAGNOSIS — M810 Age-related osteoporosis without current pathological fracture: Secondary | ICD-10-CM | POA: Diagnosis not present

## 2019-11-06 DIAGNOSIS — E039 Hypothyroidism, unspecified: Secondary | ICD-10-CM | POA: Diagnosis not present

## 2019-11-06 DIAGNOSIS — M1990 Unspecified osteoarthritis, unspecified site: Secondary | ICD-10-CM | POA: Diagnosis not present

## 2019-12-30 DIAGNOSIS — K760 Fatty (change of) liver, not elsewhere classified: Secondary | ICD-10-CM | POA: Diagnosis not present

## 2019-12-30 DIAGNOSIS — E039 Hypothyroidism, unspecified: Secondary | ICD-10-CM | POA: Diagnosis not present

## 2019-12-30 DIAGNOSIS — Z6838 Body mass index (BMI) 38.0-38.9, adult: Secondary | ICD-10-CM | POA: Diagnosis not present

## 2019-12-30 DIAGNOSIS — E782 Mixed hyperlipidemia: Secondary | ICD-10-CM | POA: Diagnosis not present

## 2019-12-30 DIAGNOSIS — M1990 Unspecified osteoarthritis, unspecified site: Secondary | ICD-10-CM | POA: Diagnosis not present

## 2019-12-30 DIAGNOSIS — I10 Essential (primary) hypertension: Secondary | ICD-10-CM | POA: Diagnosis not present

## 2019-12-30 DIAGNOSIS — G4730 Sleep apnea, unspecified: Secondary | ICD-10-CM | POA: Diagnosis not present

## 2019-12-30 DIAGNOSIS — M810 Age-related osteoporosis without current pathological fracture: Secondary | ICD-10-CM | POA: Diagnosis not present

## 2019-12-30 DIAGNOSIS — R7301 Impaired fasting glucose: Secondary | ICD-10-CM | POA: Diagnosis not present

## 2019-12-30 DIAGNOSIS — Z0000 Encounter for general adult medical examination without abnormal findings: Secondary | ICD-10-CM | POA: Diagnosis not present

## 2019-12-30 DIAGNOSIS — R311 Benign essential microscopic hematuria: Secondary | ICD-10-CM | POA: Diagnosis not present

## 2019-12-30 DIAGNOSIS — R8290 Unspecified abnormal findings in urine: Secondary | ICD-10-CM | POA: Diagnosis not present

## 2020-01-13 DIAGNOSIS — R7401 Elevation of levels of liver transaminase levels: Secondary | ICD-10-CM | POA: Diagnosis not present

## 2020-01-13 DIAGNOSIS — M542 Cervicalgia: Secondary | ICD-10-CM | POA: Diagnosis not present

## 2020-01-13 DIAGNOSIS — K760 Fatty (change of) liver, not elsewhere classified: Secondary | ICD-10-CM | POA: Diagnosis not present

## 2020-01-13 DIAGNOSIS — R7989 Other specified abnormal findings of blood chemistry: Secondary | ICD-10-CM | POA: Diagnosis not present

## 2020-01-13 DIAGNOSIS — E1169 Type 2 diabetes mellitus with other specified complication: Secondary | ICD-10-CM | POA: Diagnosis not present

## 2020-01-17 ENCOUNTER — Other Ambulatory Visit (HOSPITAL_BASED_OUTPATIENT_CLINIC_OR_DEPARTMENT_OTHER): Payer: Self-pay | Admitting: Family Medicine

## 2020-01-17 DIAGNOSIS — K760 Fatty (change of) liver, not elsewhere classified: Secondary | ICD-10-CM

## 2020-01-20 ENCOUNTER — Other Ambulatory Visit: Payer: Self-pay

## 2020-01-20 ENCOUNTER — Ambulatory Visit (HOSPITAL_BASED_OUTPATIENT_CLINIC_OR_DEPARTMENT_OTHER)
Admission: RE | Admit: 2020-01-20 | Discharge: 2020-01-20 | Disposition: A | Discharge status: Home / Self Care | Payer: PPO | Source: Ambulatory Visit | Attending: Family Medicine | Admitting: Family Medicine

## 2020-01-20 DIAGNOSIS — K760 Fatty (change of) liver, not elsewhere classified: Secondary | ICD-10-CM | POA: Insufficient documentation

## 2020-02-19 ENCOUNTER — Ambulatory Visit: Payer: PPO

## 2020-02-19 ENCOUNTER — Ambulatory Visit: Payer: PPO | Attending: Internal Medicine

## 2020-02-19 DIAGNOSIS — Z23 Encounter for immunization: Secondary | ICD-10-CM

## 2020-02-19 NOTE — Progress Notes (Signed)
   Covid-19 Vaccination Clinic  Name:  Mary Hayes    MRN: [id number] DOB: [birth year]  02/19/2020  [name] was observed post Covid-19 immunization for 15 minutes without incidence. She was provided with Vaccine Information Sheet and instruction to access the V-Safe system.   [name] was instructed to call 911 with any severe reactions post vaccine: Marland Kitchen Difficulty breathing  . Swelling of your face and throat  . A fast heartbeat  . A bad rash all over your body  . Dizziness and weakness    Immunizations Administered    Name Date Dose VIS Date Route   Pfizer COVID-19 Vaccine 02/19/2020 11:21 AM 0.3 mL 12/03/2019 Intramuscular   Manufacturer: [place]   Lot: [id number]   [place]: [id number]

## 2020-02-22 DIAGNOSIS — G4733 Obstructive sleep apnea (adult) (pediatric): Secondary | ICD-10-CM | POA: Diagnosis not present

## 2020-02-24 ENCOUNTER — Ambulatory Visit: Payer: PPO | Admitting: Dietician

## 2020-03-11 ENCOUNTER — Ambulatory Visit: Payer: PPO | Attending: Internal Medicine

## 2020-03-11 DIAGNOSIS — Z23 Encounter for immunization: Secondary | ICD-10-CM

## 2020-03-11 NOTE — Progress Notes (Signed)
   Covid-19 Vaccination Clinic  Name:  WAUNETTA TRUNDLE    MRN: [id number] DOB: 02-06-1941  03/11/2020  Ms. Milham was observed post Covid-19 immunization for 15 minutes without incident. She was provided with Vaccine Information Sheet and instruction to access the V-Safe system.   Ms. Cogdill was instructed to call 911 with any severe reactions post vaccine: Marland Kitchen Difficulty breathing  . Swelling of face and throat  . A fast heartbeat  . A bad rash all over body  . Dizziness and weakness   Immunizations Administered    Name Date Dose VIS Date Route   Pfizer COVID-19 Vaccine 03/11/2020 10:50 AM 0.3 mL 12/03/2019 Intramuscular   Manufacturer: [place]   Lot: [id number]   [place]: [id number]

## 2020-03-14 ENCOUNTER — Encounter: Payer: PPO | Admitting: Dietician

## 2020-03-15 ENCOUNTER — Ambulatory Visit: Payer: PPO

## 2020-04-12 DIAGNOSIS — E782 Mixed hyperlipidemia: Secondary | ICD-10-CM | POA: Diagnosis not present

## 2020-04-12 DIAGNOSIS — I10 Essential (primary) hypertension: Secondary | ICD-10-CM | POA: Diagnosis not present

## 2020-04-12 DIAGNOSIS — Z6838 Body mass index (BMI) 38.0-38.9, adult: Secondary | ICD-10-CM | POA: Diagnosis not present

## 2020-04-12 DIAGNOSIS — E1169 Type 2 diabetes mellitus with other specified complication: Secondary | ICD-10-CM | POA: Diagnosis not present

## 2020-04-12 DIAGNOSIS — K760 Fatty (change of) liver, not elsewhere classified: Secondary | ICD-10-CM | POA: Diagnosis not present

## 2020-04-20 ENCOUNTER — Other Ambulatory Visit: Payer: Self-pay

## 2020-04-20 ENCOUNTER — Encounter: Payer: Self-pay | Admitting: Registered"

## 2020-04-20 ENCOUNTER — Encounter: Payer: PPO | Attending: Family Medicine | Admitting: Registered"

## 2020-04-20 DIAGNOSIS — E1169 Type 2 diabetes mellitus with other specified complication: Secondary | ICD-10-CM

## 2020-04-20 NOTE — Patient Instructions (Signed)
Instructions/Goals:  Make sure to get in three meals per day. Try to have balanced meals like the My Plate example (see handout). Include carbohydrates, protein and vegetables with lunch and dinner.   Have about the same amount of carbohydrates at each meal: Around 2-3 servings/30-45g per meal. Consistency is key.   Eat a meal every 3-5 hours. If meals more than 5 hours apart have a balanced snack  Continue drinking water: 3-4 bottles per day is a great goal  Can discuss checking blood sugar at home with your doctor if you would like to check.   Make physical activity a part of your week.  Regular physical activity promotes overall health-including helping to reduce risk for heart disease and diabetes, promoting mental health, and helping [place] sleep better.    Gradually add in more time per week on stationary bike and may try out the arm exercises. May start with 10 minutes or what ever time you feel comfortable and slowly add a little each week as feels good.

## 2020-04-20 NOTE — Progress Notes (Signed)
Diabetes Self-Management Education  Visit Type: First/Initial  Appt. Start Time: 1100 Appt. End Time: 1215  04/21/2020  [name], identified by name and date of birth, is a 79 y.o. female with a diagnosis of Diabetes: Type 2.   ASSESSMENT  Height 5' 0.5" (1.537 m), weight 198 lb 4.8 oz (89.9 kg). Body mass index is 38.09 kg/m.   Pt was referred for fatty liver and new dx of diabetes.  Pt present for appointment with son who is acting as pt's interpreter for appointment. Pt signed interpreter waiver form which was interpreted to pt via [employer]. Pt speaks some [place], but primarily [place].   Pt reports her late husband had diabetes and so she is aware of the effects of diabetes on other parts of the body as well as self monitoring blood sugar and hypoglycemia. Pt is not currently checking blood sugar. Reports per doctor is going to recheck labs and then decide with pt if checking is recommended. Pt reports she is ok with checking if needed. Pt's last A1c was 6.5.  Reports she eats 2-3 meals per day. Ensure x 1 day as a meal replacement at lunch sometimes. Was doing 3 per day but did not feel well having that many. Sometimes has Cheerios with milk and banana or corn flakes  for breakfast. Reports appetite is fine.   Pt reports she and her late husband used to have their own [place]. Pt reports she still likes to cook. Reports she likes pasta but only has it about 1 day per week.   Physical activity includes 30 minutes on stationary bike, but inconsistently. Sometimes goes to grocery store and walks around for activity. Reports she does not like to walk outside by herself.   Lab Values:  12/30/19:  Hgb A1c: 6.5  Diabetes Self-Management Education - 04/20/20 1116      Visit Information   Visit Type  First/Initial      Initial Visit   Diabetes Type  Type 2    Are you currently following a meal plan?  No    Are you taking your  medications as prescribed?  Not on Medications    Date Diagnosed  December 2020      Health Coping   How would you rate your overall health?  Good      Psychosocial Assessment   Patient Belief/Attitude about Diabetes  Motivated to manage diabetes    Self-care barriers  English as a second language    Self-management support  Doctor's office    Other persons present  Interpreter;Family Member    Patient Concerns  Nutrition/Meal planning    Preferred Learning Style  No preference indicated    Learning Readiness  Ready    How often do you need to have someone help you when you read instructions, pamphlets, or other written materials from your doctor or pharmacy?  4 - Often    What is the last grade level you completed in school?  High School      Pre-Education Assessment   Patient understands the diabetes disease and treatment process.  Demonstrates understanding / competency    Patient understands incorporating nutritional management into lifestyle.  Needs Instruction    Patient undertands incorporating physical activity into lifestyle.  Needs Instruction    Patient understands using medications safely.  Demonstrates understanding / competency    Patient understands monitoring blood glucose, interpreting and using results  Demonstrates understanding / competency    Patient understands  prevention, detection, and treatment of acute complications.  Demonstrates understanding / competency    Patient understands prevention, detection, and treatment of chronic complications.  Demonstrates understanding / competency    Patient understands how to develop strategies to address psychosocial issues.  Needs Instruction    Patient understands how to develop strategies to promote health/change behavior.  Needs Instruction      Complications   Last HgB A1C per patient/outside source  6.5 %   12/30/19   How often do you check your blood sugar?  0 times/day (not testing)    Fasting Blood glucose range  (mg/dL)  --   Pt not currently checking BG.   Postprandial Blood glucose range (mg/dL)  --   Pt currently not checking BG   Number of hypoglycemic episodes per month  0   No symptoms reported. Pt not checking at home.   Number of hyperglycemic episodes per week  --   No symptoms reported. Pt not currently checking.   Have you had a dilated eye exam in the past 12 months?  Yes    Have you had a dental exam in the past 12 months?  Yes    Are you checking your feet?  No      Dietary Intake   Breakfast  830-9 AM: 2 pieces [place] toast, homemade cranberry spread, sometimes 1 cup coffee    Lunch  potato pasta with pesto sauce, water    Dinner  fortata with 1 small potato and 2 eggs, small piece of bread, water    Beverage(s)  2-3 bottles of water most days      Exercise   Exercise Type  ADL's;Light (walking / raking leaves)    How many days per week to you exercise?  2    How many minutes per day do you exercise?  20    Total minutes per week of exercise  40      Patient Education   Previous Diabetes Education  No    Disease state   Definition of diabetes, type 1 and 2, and the diagnosis of diabetes    Nutrition management   Role of diet in the treatment of diabetes and the relationship between the three main macronutrients and blood glucose level;Food label reading, portion sizes and measuring food.;Carbohydrate counting    Physical activity and exercise   Role of exercise on diabetes management, blood pressure control and cardiac health.    Monitoring  Purpose and frequency of SMBG.;Daily foot exams;Yearly dilated eye exam    Acute complications  Taught treatment of hypoglycemia - the 15 rule.    Chronic complications  Retinopathy and reason for yearly dilated eye exams;Dental care;Lipid levels, blood glucose control and heart disease      Individualized Goals (developed by patient)   Nutrition  General guidelines for healthy choices and portions discussed;Follow meal plan discussed       Post-Education Assessment   Patient understands the diabetes disease and treatment process.  Demonstrates understanding / competency    Patient understands incorporating nutritional management into lifestyle.  Demonstrates understanding / competency    Patient undertands incorporating physical activity into lifestyle.  Demonstrates understanding / competency    Patient understands using medications safely.  Demonstrates understanding / competency    Patient understands monitoring blood glucose, interpreting and using results  Demonstrates understanding / competency    Patient understands prevention, detection, and treatment of acute complications.  Demonstrates understanding / competency    Patient understands prevention, detection,  and treatment of chronic complications.  Demonstrates understanding / competency    Patient understands how to develop strategies to address psychosocial issues.  Demonstrates understanding / competency    Patient understands how to develop strategies to promote health/change behavior.  Demonstrates understanding / competency      Outcomes   Expected Outcomes  Demonstrated interest in learning. Expect positive outcomes    Future DMSE  3-4 months    Program Status  Completed       Individualized Plan for Diabetes Self-Management Training:   Learning Objective:  Patient will have a greater understanding of diabetes self-management. Patient education plan is to attend individual and/or group sessions per assessed needs and concerns.   Plan:  -Provided DSME as well as education regarding recommendations for fatty liver.   Instructions/Goals:  Make sure to get in three meals per day. Try to have balanced meals like the My Plate example (see handout). Include carbohydrates, protein and vegetables with lunch and dinner.   Have about the same amount of carbohydrates at each meal: Around 2-3 servings/30-45g per meal. Consistency is key.   Eat a meal every  3-5 hours. If meals more than 5 hours apart have a balanced snack  Continue drinking water: 3-4 bottles per day is a great goal  Can discuss checking blood sugar at home with your doctor if you would like to check.   Make physical activity a part of your week.  Regular physical activity promotes overall health-including helping to reduce risk for heart disease and diabetes, promoting mental health, and helping [place] sleep better.    Gradually add in more time per week on stationary bike and may try out the arm exercises. May start with 10 minutes or what ever time you feel comfortable and slowly add a little each week as feels good.    Patient Instructions  Instructions/Goals:  Make sure to get in three meals per day. Try to have balanced meals like the My Plate example (see handout). Include carbohydrates, protein and vegetables with lunch and dinner.   Have about the same amount of carbohydrates at each meal: Around 2-3 servings/30-45g per meal. Consistency is key.   Eat a meal every 3-5 hours. If meals more than 5 hours apart have a balanced snack  Continue drinking water: 3-4 bottles per day is a great goal  Can discuss checking blood sugar at home with your doctor if you would like to check.   Make physical activity a part of your week.  Regular physical activity promotes overall health-including helping to reduce risk for heart disease and diabetes, promoting mental health, and helping [place] sleep better.    Gradually add in more time per week on stationary bike and may try out the arm exercises. May start with 10 minutes or what ever time you feel comfortable and slowly add a little each week as feels good.       Expected Outcomes:  Demonstrated interest in learning. Expect positive outcomes  Education material provided: ADA - How to Thrive: A Guide for Your Journey with Diabetes, Meal plan card, My Plate and Snack sheet  If problems or questions, patient to contact team via:   Phone and Email  Future DSME appointment: 3-4 months

## 2020-06-14 DIAGNOSIS — E782 Mixed hyperlipidemia: Secondary | ICD-10-CM | POA: Diagnosis not present

## 2020-06-14 DIAGNOSIS — E1169 Type 2 diabetes mellitus with other specified complication: Secondary | ICD-10-CM | POA: Diagnosis not present

## 2020-06-14 DIAGNOSIS — M810 Age-related osteoporosis without current pathological fracture: Secondary | ICD-10-CM | POA: Diagnosis not present

## 2020-06-14 DIAGNOSIS — I10 Essential (primary) hypertension: Secondary | ICD-10-CM | POA: Diagnosis not present

## 2020-06-14 DIAGNOSIS — E7841 Elevated Lipoprotein(a): Secondary | ICD-10-CM | POA: Diagnosis not present

## 2020-06-14 DIAGNOSIS — M1990 Unspecified osteoarthritis, unspecified site: Secondary | ICD-10-CM | POA: Diagnosis not present

## 2020-06-14 DIAGNOSIS — E039 Hypothyroidism, unspecified: Secondary | ICD-10-CM | POA: Diagnosis not present

## 2020-07-07 DIAGNOSIS — H26493 Other secondary cataract, bilateral: Secondary | ICD-10-CM | POA: Diagnosis not present

## 2020-07-07 DIAGNOSIS — H52223 Regular astigmatism, bilateral: Secondary | ICD-10-CM | POA: Diagnosis not present

## 2020-07-07 DIAGNOSIS — H524 Presbyopia: Secondary | ICD-10-CM | POA: Diagnosis not present

## 2020-07-07 DIAGNOSIS — H43813 Vitreous degeneration, bilateral: Secondary | ICD-10-CM | POA: Diagnosis not present

## 2020-07-07 DIAGNOSIS — H04123 Dry eye syndrome of bilateral lacrimal glands: Secondary | ICD-10-CM | POA: Diagnosis not present

## 2020-07-07 DIAGNOSIS — H353111 Nonexudative age-related macular degeneration, right eye, early dry stage: Secondary | ICD-10-CM | POA: Diagnosis not present

## 2020-07-14 DIAGNOSIS — Z471 Aftercare following joint replacement surgery: Secondary | ICD-10-CM | POA: Diagnosis not present

## 2020-07-14 DIAGNOSIS — M25551 Pain in right hip: Secondary | ICD-10-CM | POA: Diagnosis not present

## 2020-07-14 DIAGNOSIS — Z96651 Presence of right artificial knee joint: Secondary | ICD-10-CM | POA: Diagnosis not present

## 2020-07-14 DIAGNOSIS — M5416 Radiculopathy, lumbar region: Secondary | ICD-10-CM | POA: Diagnosis not present

## 2020-07-20 ENCOUNTER — Ambulatory Visit: Payer: PPO | Admitting: Registered"

## 2020-07-20 DIAGNOSIS — E1169 Type 2 diabetes mellitus with other specified complication: Secondary | ICD-10-CM | POA: Diagnosis not present

## 2020-08-16 DIAGNOSIS — M810 Age-related osteoporosis without current pathological fracture: Secondary | ICD-10-CM | POA: Diagnosis not present

## 2020-08-16 DIAGNOSIS — E7841 Elevated Lipoprotein(a): Secondary | ICD-10-CM | POA: Diagnosis not present

## 2020-08-16 DIAGNOSIS — E1169 Type 2 diabetes mellitus with other specified complication: Secondary | ICD-10-CM | POA: Diagnosis not present

## 2020-08-16 DIAGNOSIS — E782 Mixed hyperlipidemia: Secondary | ICD-10-CM | POA: Diagnosis not present

## 2020-08-16 DIAGNOSIS — M1990 Unspecified osteoarthritis, unspecified site: Secondary | ICD-10-CM | POA: Diagnosis not present

## 2020-08-16 DIAGNOSIS — E039 Hypothyroidism, unspecified: Secondary | ICD-10-CM | POA: Diagnosis not present

## 2020-08-16 DIAGNOSIS — I10 Essential (primary) hypertension: Secondary | ICD-10-CM | POA: Diagnosis not present

## 2020-08-17 DIAGNOSIS — M545 Low back pain: Secondary | ICD-10-CM | POA: Diagnosis not present

## 2020-08-23 ENCOUNTER — Other Ambulatory Visit: Payer: Self-pay | Admitting: Orthopedic Surgery

## 2020-08-23 DIAGNOSIS — M48061 Spinal stenosis, lumbar region without neurogenic claudication: Secondary | ICD-10-CM

## 2020-08-31 ENCOUNTER — Other Ambulatory Visit: Payer: Self-pay

## 2020-08-31 ENCOUNTER — Ambulatory Visit
Admission: RE | Admit: 2020-08-31 | Discharge: 2020-08-31 | Disposition: A | Discharge status: Home / Self Care | Payer: PPO | Source: Ambulatory Visit | Attending: Orthopedic Surgery | Admitting: Orthopedic Surgery

## 2020-08-31 DIAGNOSIS — M4727 Other spondylosis with radiculopathy, lumbosacral region: Secondary | ICD-10-CM | POA: Diagnosis not present

## 2020-08-31 DIAGNOSIS — M48061 Spinal stenosis, lumbar region without neurogenic claudication: Secondary | ICD-10-CM

## 2020-08-31 MED ORDER — IOPAMIDOL (ISOVUE-M 200) INJECTION 41%
1.0000 mL | Freq: Once | INTRAMUSCULAR | Status: AC
  Administered 2020-08-31: 1 mL via EPIDURAL

## 2020-08-31 MED ORDER — METHYLPREDNISOLONE ACETATE 40 MG/ML INJ SUSP (RADIOLOG
120.0000 mg | Freq: Once | INTRAMUSCULAR | Status: AC
  Administered 2020-08-31: 120 mg via EPIDURAL

## 2020-09-08 DIAGNOSIS — E039 Hypothyroidism, unspecified: Secondary | ICD-10-CM | POA: Diagnosis not present

## 2020-09-08 DIAGNOSIS — E782 Mixed hyperlipidemia: Secondary | ICD-10-CM | POA: Diagnosis not present

## 2020-09-08 DIAGNOSIS — M1990 Unspecified osteoarthritis, unspecified site: Secondary | ICD-10-CM | POA: Diagnosis not present

## 2020-09-08 DIAGNOSIS — E1169 Type 2 diabetes mellitus with other specified complication: Secondary | ICD-10-CM | POA: Diagnosis not present

## 2020-09-08 DIAGNOSIS — I10 Essential (primary) hypertension: Secondary | ICD-10-CM | POA: Diagnosis not present

## 2020-09-08 DIAGNOSIS — E7841 Elevated Lipoprotein(a): Secondary | ICD-10-CM | POA: Diagnosis not present

## 2020-09-08 DIAGNOSIS — M810 Age-related osteoporosis without current pathological fracture: Secondary | ICD-10-CM | POA: Diagnosis not present

## 2020-10-23 DIAGNOSIS — Z1231 Encounter for screening mammogram for malignant neoplasm of breast: Secondary | ICD-10-CM | POA: Diagnosis not present

## 2020-12-04 DIAGNOSIS — I10 Essential (primary) hypertension: Secondary | ICD-10-CM | POA: Diagnosis not present

## 2020-12-04 DIAGNOSIS — M1990 Unspecified osteoarthritis, unspecified site: Secondary | ICD-10-CM | POA: Diagnosis not present

## 2020-12-04 DIAGNOSIS — E039 Hypothyroidism, unspecified: Secondary | ICD-10-CM | POA: Diagnosis not present

## 2020-12-04 DIAGNOSIS — M810 Age-related osteoporosis without current pathological fracture: Secondary | ICD-10-CM | POA: Diagnosis not present

## 2020-12-04 DIAGNOSIS — E782 Mixed hyperlipidemia: Secondary | ICD-10-CM | POA: Diagnosis not present

## 2020-12-04 DIAGNOSIS — E1169 Type 2 diabetes mellitus with other specified complication: Secondary | ICD-10-CM | POA: Diagnosis not present

## 2021-01-18 DIAGNOSIS — H524 Presbyopia: Secondary | ICD-10-CM | POA: Diagnosis not present

## 2021-01-18 DIAGNOSIS — H52223 Regular astigmatism, bilateral: Secondary | ICD-10-CM | POA: Diagnosis not present

## 2021-01-18 DIAGNOSIS — H353111 Nonexudative age-related macular degeneration, right eye, early dry stage: Secondary | ICD-10-CM | POA: Diagnosis not present

## 2021-01-18 DIAGNOSIS — H04123 Dry eye syndrome of bilateral lacrimal glands: Secondary | ICD-10-CM | POA: Diagnosis not present

## 2021-01-18 DIAGNOSIS — H43813 Vitreous degeneration, bilateral: Secondary | ICD-10-CM | POA: Diagnosis not present

## 2021-01-18 DIAGNOSIS — H26493 Other secondary cataract, bilateral: Secondary | ICD-10-CM | POA: Diagnosis not present

## 2021-02-08 ENCOUNTER — Other Ambulatory Visit: Payer: Self-pay | Admitting: Family Medicine

## 2021-02-08 ENCOUNTER — Other Ambulatory Visit: Payer: Self-pay

## 2021-02-08 ENCOUNTER — Emergency Department (HOSPITAL_COMMUNITY): Payer: PPO

## 2021-02-08 ENCOUNTER — Encounter (HOSPITAL_COMMUNITY): Payer: Self-pay

## 2021-02-08 ENCOUNTER — Emergency Department (HOSPITAL_COMMUNITY)
Admission: EM | Admit: 2021-02-08 | Discharge: 2021-02-08 | Disposition: A | Discharge status: Home / Self Care | Payer: PPO | Attending: Emergency Medicine | Admitting: Emergency Medicine

## 2021-02-08 DIAGNOSIS — R42 Dizziness and giddiness: Secondary | ICD-10-CM | POA: Insufficient documentation

## 2021-02-08 DIAGNOSIS — E039 Hypothyroidism, unspecified: Secondary | ICD-10-CM | POA: Insufficient documentation

## 2021-02-08 DIAGNOSIS — Z79899 Other long term (current) drug therapy: Secondary | ICD-10-CM | POA: Insufficient documentation

## 2021-02-08 DIAGNOSIS — Z1389 Encounter for screening for other disorder: Secondary | ICD-10-CM | POA: Diagnosis not present

## 2021-02-08 DIAGNOSIS — I10 Essential (primary) hypertension: Secondary | ICD-10-CM | POA: Diagnosis not present

## 2021-02-08 DIAGNOSIS — Z96653 Presence of artificial knee joint, bilateral: Secondary | ICD-10-CM | POA: Insufficient documentation

## 2021-02-08 DIAGNOSIS — M810 Age-related osteoporosis without current pathological fracture: Secondary | ICD-10-CM

## 2021-02-08 DIAGNOSIS — Z0000 Encounter for general adult medical examination without abnormal findings: Secondary | ICD-10-CM | POA: Diagnosis not present

## 2021-02-08 DIAGNOSIS — R410 Disorientation, unspecified: Secondary | ICD-10-CM | POA: Diagnosis not present

## 2021-02-08 LAB — CBC WITH DIFFERENTIAL/PLATELET
Abs Immature Granulocytes: 0.02 10*3/uL (ref 0.00–0.07)
Basophils Absolute: 0.1 10*3/uL (ref 0.0–0.1)
Basophils Relative: 1 %
Eosinophils Absolute: 0.2 10*3/uL (ref 0.0–0.5)
Eosinophils Relative: 3 %
HCT: 40.6 % (ref 36.0–46.0)
Hemoglobin: 13.6 g/dL (ref 12.0–15.0)
Immature Granulocytes: 0 %
Lymphocytes Relative: 38 %
Lymphs Abs: 3 10*3/uL (ref 0.7–4.0)
MCH: 28.9 pg (ref 26.0–34.0)
MCHC: 33.5 g/dL (ref 30.0–36.0)
MCV: 86.4 fL (ref 80.0–100.0)
Monocytes Absolute: 0.7 10*3/uL (ref 0.1–1.0)
Monocytes Relative: 8 %
Neutro Abs: 4 10*3/uL (ref 1.7–7.7)
Neutrophils Relative %: 50 %
Platelets: 226 10*3/uL (ref 150–400)
RBC: 4.7 MIL/uL (ref 3.87–5.11)
RDW: 13.2 % (ref 11.5–15.5)
WBC: 8 10*3/uL (ref 4.0–10.5)
nRBC: 0 % (ref 0.0–0.2)

## 2021-02-08 LAB — COMPREHENSIVE METABOLIC PANEL
ALT: 38 U/L (ref 0–44)
AST: 46 U/L — ABNORMAL HIGH (ref 15–41)
Albumin: 4.3 g/dL (ref 3.5–5.0)
Alkaline Phosphatase: 41 U/L (ref 38–126)
Anion gap: 12 (ref 5–15)
BUN: 18 mg/dL (ref 8–23)
CO2: 23 mmol/L (ref 22–32)
Calcium: 9.6 mg/dL (ref 8.9–10.3)
Chloride: 104 mmol/L (ref 98–111)
Creatinine, Ser: 0.65 mg/dL (ref 0.44–1.00)
GFR, Estimated: >60 mL/min (ref >60)
Glucose, Bld: 105 mg/dL — ABNORMAL HIGH (ref 70–99)
Potassium: 4.4 mmol/L (ref 3.5–5.1)
Sodium: 139 mmol/L (ref 135–145)
Total Bilirubin: 0.9 mg/dL (ref 0.3–1.2)
Total Protein: 7.9 g/dL (ref 6.5–8.1)

## 2021-02-08 MED ORDER — LOSARTAN POTASSIUM 25 MG PO TABS
50.0000 mg | ORAL_TABLET | Freq: Once | ORAL | Status: AC
  Administered 2021-02-08: 50 mg via ORAL
  Filled 2021-02-08: qty 2

## 2021-02-08 MED ORDER — HYDROCHLOROTHIAZIDE 25 MG PO TABS: 25.0000 mg | ORAL_TABLET | Freq: Every day | ORAL | 0 refills | Status: AC

## 2021-02-08 NOTE — Discharge Instructions (Signed)
Your MRI and your blood work was unremarkable today.  You need to continue losartan 50 mg and I added hydrochlorothiazide 25 mg daily that you should take in the morning.  Please keep a log of your blood pressures  See your primary care doctor in a week to recheck your blood pressure  Return to ER if you have worse dizziness, chest pain, trouble breathing, abdominal pain, vomiting.

## 2021-02-08 NOTE — ED Provider Notes (Signed)
[facility] DEPT Provider Note   CSN: [id number] Arrival date & time: 02/08/21  1938     History Chief Complaint  Patient presents with  . Hypertension    Mary Hayes is a 80 y.o. female history of hypertension, high cholesterol, here presenting with dizziness and confusion.  She states that she woke up today and felt lightheaded and dizzy.  She also states that she just feels very foggy in her head and mildly confused.  Patient has been checking her blood pressure at home and it was running 190s to 200.  She is taking her losartan 50 mg daily as prescribed.  Denies any chest pain or shortness of breath or abdominal pain.  Denies any history of stroke in the past.  Patient speaks [place] and son is at bedside translating.  The history is provided by the patient, a caregiver and a relative.       Past Medical History:  Diagnosis Date  . Arthritis   . Hypercholesteremia   . Hypertension   . Hypothyroidism   . Sleep apnea    cpap    Patient Active Problem List   Diagnosis Date Noted  . OA (osteoarthritis) of knee 01/11/2019    Past Surgical History:  Procedure Laterality Date  . HAND SURGERY     right hand pinched nerve  . JOINT REPLACEMENT Right    Knee  . TONSILLECTOMY    . 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   Procedure: TOTAL KNEE ARTHROPLASTY;  Surgeon: Gaynelle Arabian, MD;  Location: [facility];  Service: Orthopedics;  Laterality: Left;  71min     OB History   No obstetric history on file.     History reviewed. No pertinent family history.  Social History   Tobacco Use  . Smoking status: Never Smoker  . Smokeless tobacco: Never Used  Vaping Use  . Vaping Use: Never used  Substance Use Topics  . Alcohol use: No  . Drug use: No    Home Medications Prior to Admission medications   Medication Sig Start Date End Date Taking? Authorizing Provider  acetaminophen (TYLENOL) 650 MG CR tablet Take 650 mg by mouth every 8  (eight) hours as needed for pain.    [provider]  atorvastatin (LIPITOR) 10 MG tablet Take 10 mg by mouth daily.    [provider]  cholecalciferol (VITAMIN D3) 25 MCG (1000 UT) tablet Take 1,000 Units by mouth daily.    [provider]  hydrocortisone cream 1 % Apply 1 application topically 3 (three) times daily as needed for itching.    [provider]  levothyroxine (SYNTHROID, LEVOTHROID) 25 MCG tablet Take 25 mcg by mouth daily. 12/24/18   [provider]  losartan (COZAAR) 50 MG tablet Take 50 mg by mouth daily.    [provider]  methocarbamol (ROBAXIN) 500 MG tablet Take 1 tablet (500 mg total) by mouth every 6 (six) hours as needed for muscle spasms. 01/13/19   Edmisten, Ok Anis, PA  Multiple Vitamin (MULTIVITAMIN WITH MINERALS) TABS tablet Take 1 tablet by mouth daily.    [provider]  oxyCODONE (OXY IR/ROXICODONE) 5 MG immediate release tablet Take 1-2 tablets (5-10 mg total) by mouth every 6 (six) hours as needed for moderate pain or severe pain. 01/13/19   Edmisten, Ok Anis, PA  polyvinyl alcohol (LIQUIFILM TEARS) 1.4 % ophthalmic solution Place 1 drop into both eyes as needed for dry eyes.    [provider]  psyllium (HYDROCIL/METAMUCIL) 95 % PACK Take 1 packet by mouth daily.    [provider]  raloxifene (EVISTA) 60 MG tablet Take 60 mg by mouth daily. 11/24/18   [provider]  sodium chloride (OCEAN) 0.65 % SOLN nasal spray Place 1 spray into both nostrils as needed for congestion.    [provider]  traMADol (ULTRAM) 50 MG tablet Take 1-2 tablets (50-100 mg total) by mouth every 6 (six) hours as needed for moderate pain (if oxycodone alone insufficient). 01/13/19   Edmisten, Ok Anis, PA    Allergies    Lisinopril  Review of Systems   Review of Systems  Neurological: Positive for dizziness.  All other systems reviewed and are negative.   Physical Exam Updated  Vital Signs BP (!) 178/73   Pulse 64   Temp 98.8 F (37.1 C) (Oral)   Resp 18   SpO2 93%   Physical Exam Vitals and nursing note reviewed.  Constitutional:      Appearance: Normal appearance.  HENT:     Head: Normocephalic.     Nose: Nose normal.     Mouth/Throat:     Mouth: Mucous membranes are moist.  Eyes:     Extraocular Movements: Extraocular movements intact.     Pupils: Pupils are equal, round, and reactive to light.  Cardiovascular:     Rate and Rhythm: Normal rate and regular rhythm.     Pulses: Normal pulses.     Heart sounds: Normal heart sounds.  Pulmonary:     Effort: Pulmonary effort is normal.     Breath sounds: Normal breath sounds.  Abdominal:     General: Abdomen is flat.     Palpations: Abdomen is soft.  Musculoskeletal:        General: Normal range of motion.     Cervical back: Normal range of motion and neck supple.  Skin:    General: Skin is warm.     Capillary Refill: Capillary refill takes less than 2 seconds.  Neurological:     General: No focal deficit present.     Mental Status: She is alert and oriented to person, place, and time.     Cranial Nerves: No cranial nerve deficit.     Sensory: No sensory deficit.     Motor: No weakness.     Coordination: Coordination normal.     Comments: Cranial nerves II to XII intact, normal finger-to-nose bilaterally.  Normal strength and sensation.  Negative Romberg.  Normal gait.  Psychiatric:        Mood and Affect: Mood normal.        Behavior: Behavior normal.     ED Results / Procedures / Treatments   Labs (all labs ordered are listed, but only abnormal results are displayed) Labs Reviewed  COMPREHENSIVE METABOLIC PANEL - Abnormal; Notable for the following components:      Result Value   Glucose, Bld 105 (*)    AST 46 (*)    All other components within normal limits  CBC WITH DIFFERENTIAL/PLATELET    EKG EKG Interpretation  Date/Time:  Thursday February 08 2021 20:33:10  EST Ventricular Rate:  66 PR Interval:    QRS Duration: 94 QT Interval:  446 QTC Calculation: 468 R Axis:   -33 Text Interpretation: Sinus rhythm Atrial premature complex Abnormal R-wave progression, late transition LVH with secondary repolarization abnormality No significant change since last tracing Confirmed by Wandra Arthurs [phone]) on 02/08/2021 8:46:19 PM   Radiology MR BRAIN WO  CONTRAST  Result Date: 02/08/2021 CLINICAL DATA:  Transient ischemic attack.  Dizziness EXAM: MRI HEAD WITHOUT CONTRAST TECHNIQUE: Multiplanar, multiecho pulse sequences of the brain and surrounding structures were obtained without intravenous contrast. COMPARISON:  None. FINDINGS: Brain: No acute infarct, mass effect or extra-axial collection. No acute or chronic hemorrhage. Old left basal ganglia small vessel infarct. Mild multifocal white matter hyperintensity. The midline structures are normal. Vascular: Major flow voids are preserved. Skull and upper cervical spine: Normal calvarium and skull base. Visualized upper cervical spine and soft tissues are normal. Sinuses/Orbits:No paranasal sinus fluid levels or advanced mucosal thickening. No mastoid or middle ear effusion. Normal orbits. IMPRESSION: 1. No acute intracranial abnormality. 2. Old left basal ganglia small vessel infarct. Electronically Signed   By: Ulyses Jarred M.D.   On: 02/08/2021 21:24    Procedures Procedures   Medications Ordered in ED Medications  losartan (COZAAR) tablet 50 mg (50 mg Oral Given 02/08/21 2130)    ED Course  I have reviewed the triage vital signs and the nursing notes.  Pertinent labs & imaging results that were available during my care of the patient were reviewed by me and considered in my medical decision making (see chart for details).    MDM Rules/Calculators/A&P                         AKIRE RENNERT is a 80 y.o. female here with dizziness.  Patient likely has symptomatic hypertension.  She is compliant with her  losartan 50 mg.  Her blood pressure was in the 190s to 200s.  Patient has no focal neuro deficit.  Consider posterior circulation stroke versus bleed. Plan to get MRI brain and CBC and CMP.   9:47 PM Labs and MRI unremarkable.  Blood pressure is stable around 160s to 180s.  She was given extra dose of losartan.  Will add hydrochlorothiazide 25 mg and will have her follow-up with PCP.  Likely symptomatic hypertension    Final Clinical Impression(s) / ED Diagnoses Final diagnoses:  None    Rx / DC Orders ED Discharge Orders    None       Drenda Freeze, MD 02/08/21 2149

## 2021-02-08 NOTE — ED Triage Notes (Signed)
Pt reports feeling dizzy, weak, and confused today. Pt checked her BP and it was 208/96. Bilateral grips, strength, and sensation. No facial droop present. Pt speaks [place] and son is at bedside.

## 2021-02-20 ENCOUNTER — Encounter (HOSPITAL_COMMUNITY): Payer: Self-pay

## 2021-02-20 ENCOUNTER — Other Ambulatory Visit: Payer: Self-pay

## 2021-02-20 ENCOUNTER — Emergency Department (HOSPITAL_COMMUNITY): Payer: PPO

## 2021-02-20 ENCOUNTER — Emergency Department (HOSPITAL_COMMUNITY)
Admission: EM | Admit: 2021-02-20 | Discharge: 2021-02-20 | Disposition: A | Discharge status: Home / Self Care | Payer: PPO | Attending: Emergency Medicine | Admitting: Emergency Medicine

## 2021-02-20 DIAGNOSIS — R059 Cough, unspecified: Secondary | ICD-10-CM | POA: Insufficient documentation

## 2021-02-20 DIAGNOSIS — R079 Chest pain, unspecified: Secondary | ICD-10-CM | POA: Diagnosis not present

## 2021-02-20 DIAGNOSIS — R0789 Other chest pain: Secondary | ICD-10-CM | POA: Diagnosis not present

## 2021-02-20 DIAGNOSIS — Z96653 Presence of artificial knee joint, bilateral: Secondary | ICD-10-CM | POA: Diagnosis not present

## 2021-02-20 DIAGNOSIS — I10 Essential (primary) hypertension: Secondary | ICD-10-CM | POA: Diagnosis not present

## 2021-02-20 DIAGNOSIS — E039 Hypothyroidism, unspecified: Secondary | ICD-10-CM | POA: Diagnosis not present

## 2021-02-20 DIAGNOSIS — Z79899 Other long term (current) drug therapy: Secondary | ICD-10-CM | POA: Diagnosis not present

## 2021-02-20 DIAGNOSIS — R0602 Shortness of breath: Secondary | ICD-10-CM | POA: Insufficient documentation

## 2021-02-20 MED ORDER — IBUPROFEN 200 MG PO TABS
400.0000 mg | ORAL_TABLET | Freq: Once | ORAL | Status: AC
  Administered 2021-02-20: 400 mg via ORAL
  Filled 2021-02-20: qty 2

## 2021-02-20 NOTE — ED Triage Notes (Signed)
CP under left breast with movement and associated with a dry cough that started this am. Denies fever, shob.

## 2021-02-20 NOTE — ED Provider Notes (Signed)
[facility] DEPT Provider Note   CSN: [id number] Arrival date & time: 02/20/21  0854     History Chief Complaint  Patient presents with  . Chest Pain  . Cough    Mary Hayes is a 80 y.o. female.  80 year old female presents with a week and a half of pinpoint sharp chest pain.  Denies any trauma.  She has not been short of breath.  Slight cough but no fever or chills.  Pain is positional.  Denies any anginal or CHF type qualities.  No new pain or swelling to her legs.  Pain better with remaining still.  No treatment use prior to arrival        Past Medical History:  Diagnosis Date  . Arthritis   . Hypercholesteremia   . Hypertension   . Hypothyroidism   . Sleep apnea    cpap    Patient Active Problem List   Diagnosis Date Noted  . OA (osteoarthritis) of knee 01/11/2019    Past Surgical History:  Procedure Laterality Date  . HAND SURGERY     right hand pinched nerve  . JOINT REPLACEMENT Right    Knee  . TONSILLECTOMY    . 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   Procedure: TOTAL KNEE ARTHROPLASTY;  Surgeon: Gaynelle Arabian, MD;  Location: [facility];  Service: Orthopedics;  Laterality: Left;  31min     OB History   No obstetric history on file.     No family history on file.  Social History   Tobacco Use  . Smoking status: Never Smoker  . Smokeless tobacco: Never Used  Vaping Use  . Vaping Use: Never used  Substance Use Topics  . Alcohol use: No  . Drug use: No    Home Medications Prior to Admission medications   Medication Sig Start Date End Date Taking? Authorizing Provider  acetaminophen (TYLENOL) 650 MG CR tablet Take 650 mg by mouth every 8 (eight) hours as needed for pain.    [provider]  atorvastatin (LIPITOR) 10 MG tablet Take 10 mg by mouth daily.    [provider]  cholecalciferol (VITAMIN D3) 25 MCG (1000 UT) tablet Take 1,000 Units by mouth daily.    [provider]   hydrochlorothiazide (HYDRODIURIL) 25 MG tablet Take 1 tablet (25 mg total) by mouth daily. 02/08/21   Drenda Freeze, MD  hydrocortisone cream 1 % Apply 1 application topically 3 (three) times daily as needed for itching.    [provider]  levothyroxine (SYNTHROID, LEVOTHROID) 25 MCG tablet Take 25 mcg by mouth daily. 12/24/18   [provider]  losartan (COZAAR) 50 MG tablet Take 50 mg by mouth daily.    [provider]  methocarbamol (ROBAXIN) 500 MG tablet Take 1 tablet (500 mg total) by mouth every 6 (six) hours as needed for muscle spasms. 01/13/19   Edmisten, Ok Anis, PA  Multiple Vitamin (MULTIVITAMIN WITH MINERALS) TABS tablet Take 1 tablet by mouth daily.    [provider]  oxyCODONE (OXY IR/ROXICODONE) 5 MG immediate release tablet Take 1-2 tablets (5-10 mg total) by mouth every 6 (six) hours as needed for moderate pain or severe pain. 01/13/19   Edmisten, Ok Anis, PA  polyvinyl alcohol (LIQUIFILM TEARS) 1.4 % ophthalmic solution Place 1 drop into both eyes as needed for dry eyes.    [provider]  psyllium (HYDROCIL/METAMUCIL) 95 % PACK Take 1 packet by mouth daily.    [provider]  raloxifene (EVISTA) 60 MG tablet Take 60 mg by mouth daily. 11/24/18   [provider]  sodium chloride (OCEAN) 0.65 % SOLN nasal spray Place 1 spray into both nostrils as needed for congestion.    [provider]  traMADol (ULTRAM) 50 MG tablet Take 1-2 tablets (50-100 mg total) by mouth every 6 (six) hours as needed for moderate pain (if oxycodone alone insufficient). 01/13/19   Edmisten, Ok Anis, PA    Allergies    Lisinopril  Review of Systems   Review of Systems  All other systems reviewed and are negative.   Physical Exam Updated Vital Signs BP (!) 168/87 (BP Location: Right Arm)   Pulse 77   Temp 98.4 F (36.9 C) (Oral)   Resp 19   SpO2 98%   Physical Exam Vitals and nursing note reviewed.   Constitutional:      General: She is not in acute distress.    Appearance: Normal appearance. She is well-developed and well-nourished. She is not toxic-appearing.  HENT:     Head: Normocephalic and atraumatic.  Eyes:     General: Lids are normal.     Extraocular Movements: EOM normal.     Conjunctiva/sclera: Conjunctivae normal.     Pupils: Pupils are equal, round, and reactive to light.  Neck:     Thyroid: No thyroid mass.     Trachea: No tracheal deviation.  Cardiovascular:     Rate and Rhythm: Normal rate and regular rhythm.     Heart sounds: Normal heart sounds. No murmur heard. No gallop.   Pulmonary:     Effort: Pulmonary effort is normal. No respiratory distress.     Breath sounds: Normal breath sounds. No stridor. No decreased breath sounds, wheezing, rhonchi or rales.  Chest:     Chest wall: Tenderness present. No crepitus.    Abdominal:     General: Bowel sounds are normal. There is no distension.     Palpations: Abdomen is soft.     Tenderness: There is no abdominal tenderness. There is no CVA tenderness or rebound.  Musculoskeletal:        General: No tenderness or edema. Normal range of motion.     Cervical back: Normal range of motion and neck supple.  Skin:    General: Skin is warm and dry.     Findings: No abrasion or rash.  Neurological:     Mental Status: She is alert and oriented to person, place, and time.     GCS: GCS eye subscore is 4. GCS verbal subscore is 5. GCS motor subscore is 6.     Cranial Nerves: No cranial nerve deficit.     Sensory: No sensory deficit.     Deep Tendon Reflexes: Strength normal.  Psychiatric:        Mood and Affect: Mood and affect normal.        Speech: Speech normal.        Behavior: Behavior normal.     ED Results / Procedures / Treatments   Labs (all labs ordered are listed, but only abnormal results are displayed) Labs Reviewed - No data to display  EKG EKG Interpretation  Date/Time:  Tuesday February 20 2021  10:58:22 EST Ventricular Rate:  70 PR Interval:    QRS Duration: 99 QT Interval:  429 QTC Calculation: 463 R Axis:   -43 Text Interpretation: Sinus rhythm Left anterior fascicular block Abnormal R-wave progression, early transition Left ventricular hypertrophy No significant change since last tracing Confirmed  by Lacretia Leigh ([id number]) on 02/20/2021 10:59:56 AM   Radiology No results found.  Procedures Procedures   Medications Ordered in ED Medications  ibuprofen (ADVIL) tablet 400 mg (has no administration in time range)    ED Course  I have reviewed the triage vital signs and the nursing notes.  Pertinent labs & imaging results that were available during my care of the patient were reviewed by me and considered in my medical decision making (see chart for details).    MDM Rules/Calculators/A&P                          Patient given Motrin and feels better. Chest x-ray without acute findings. Suspect chest wall pain no concern for ACS. Will discharge Final Clinical Impression(s) / ED Diagnoses Final diagnoses:  SOB (shortness of breath)    Rx / DC Orders ED Discharge Orders    None       Lacretia Leigh, MD 02/20/21 1101

## 2021-03-21 DIAGNOSIS — G4730 Sleep apnea, unspecified: Secondary | ICD-10-CM | POA: Diagnosis not present

## 2021-03-21 DIAGNOSIS — E1169 Type 2 diabetes mellitus with other specified complication: Secondary | ICD-10-CM | POA: Diagnosis not present

## 2021-03-21 DIAGNOSIS — M5137 Other intervertebral disc degeneration, lumbosacral region: Secondary | ICD-10-CM | POA: Diagnosis not present

## 2021-03-21 DIAGNOSIS — I10 Essential (primary) hypertension: Secondary | ICD-10-CM | POA: Diagnosis not present

## 2021-03-21 DIAGNOSIS — E782 Mixed hyperlipidemia: Secondary | ICD-10-CM | POA: Diagnosis not present

## 2021-03-21 DIAGNOSIS — M810 Age-related osteoporosis without current pathological fracture: Secondary | ICD-10-CM | POA: Diagnosis not present

## 2021-03-21 DIAGNOSIS — E039 Hypothyroidism, unspecified: Secondary | ICD-10-CM | POA: Diagnosis not present

## 2021-03-21 DIAGNOSIS — Z789 Other specified health status: Secondary | ICD-10-CM | POA: Diagnosis not present

## 2021-05-17 DIAGNOSIS — K5730 Diverticulosis of large intestine without perforation or abscess without bleeding: Secondary | ICD-10-CM | POA: Diagnosis not present

## 2021-05-17 DIAGNOSIS — Z1211 Encounter for screening for malignant neoplasm of colon: Secondary | ICD-10-CM | POA: Diagnosis not present

## 2021-05-17 DIAGNOSIS — K219 Gastro-esophageal reflux disease without esophagitis: Secondary | ICD-10-CM | POA: Diagnosis not present

## 2021-07-26 DIAGNOSIS — H524 Presbyopia: Secondary | ICD-10-CM | POA: Diagnosis not present

## 2021-07-26 DIAGNOSIS — H52223 Regular astigmatism, bilateral: Secondary | ICD-10-CM | POA: Diagnosis not present

## 2021-07-26 DIAGNOSIS — H04123 Dry eye syndrome of bilateral lacrimal glands: Secondary | ICD-10-CM | POA: Diagnosis not present

## 2021-07-26 DIAGNOSIS — H35372 Puckering of macula, left eye: Secondary | ICD-10-CM | POA: Diagnosis not present

## 2021-07-26 DIAGNOSIS — H43813 Vitreous degeneration, bilateral: Secondary | ICD-10-CM | POA: Diagnosis not present

## 2021-07-26 DIAGNOSIS — H353111 Nonexudative age-related macular degeneration, right eye, early dry stage: Secondary | ICD-10-CM | POA: Diagnosis not present

## 2021-07-26 DIAGNOSIS — H26493 Other secondary cataract, bilateral: Secondary | ICD-10-CM | POA: Diagnosis not present

## 2021-09-13 DIAGNOSIS — Z1211 Encounter for screening for malignant neoplasm of colon: Secondary | ICD-10-CM | POA: Diagnosis not present

## 2021-09-13 DIAGNOSIS — D122 Benign neoplasm of ascending colon: Secondary | ICD-10-CM | POA: Diagnosis not present

## 2021-09-13 DIAGNOSIS — D175 Benign lipomatous neoplasm of intra-abdominal organs: Secondary | ICD-10-CM | POA: Diagnosis not present

## 2021-09-13 DIAGNOSIS — K5730 Diverticulosis of large intestine without perforation or abscess without bleeding: Secondary | ICD-10-CM | POA: Diagnosis not present

## 2021-09-13 DIAGNOSIS — K648 Other hemorrhoids: Secondary | ICD-10-CM | POA: Diagnosis not present

## 2021-09-18 DIAGNOSIS — D122 Benign neoplasm of ascending colon: Secondary | ICD-10-CM | POA: Diagnosis not present

## 2021-10-03 ENCOUNTER — Other Ambulatory Visit: Payer: Self-pay

## 2021-10-03 ENCOUNTER — Ambulatory Visit
Admission: RE | Admit: 2021-10-03 | Discharge: 2021-10-03 | Disposition: A | Discharge status: Home / Self Care | Payer: PPO | Source: Ambulatory Visit | Attending: Family Medicine | Admitting: Family Medicine

## 2021-10-03 DIAGNOSIS — Z780 Asymptomatic menopausal state: Secondary | ICD-10-CM | POA: Diagnosis not present

## 2021-10-03 DIAGNOSIS — M810 Age-related osteoporosis without current pathological fracture: Secondary | ICD-10-CM

## 2021-10-03 DIAGNOSIS — M85852 Other specified disorders of bone density and structure, left thigh: Secondary | ICD-10-CM | POA: Diagnosis not present

## 2021-10-10 DIAGNOSIS — E039 Hypothyroidism, unspecified: Secondary | ICD-10-CM | POA: Diagnosis not present

## 2021-10-10 DIAGNOSIS — I10 Essential (primary) hypertension: Secondary | ICD-10-CM | POA: Diagnosis not present

## 2021-10-10 DIAGNOSIS — G4730 Sleep apnea, unspecified: Secondary | ICD-10-CM | POA: Diagnosis not present

## 2021-10-10 DIAGNOSIS — R1310 Dysphagia, unspecified: Secondary | ICD-10-CM | POA: Diagnosis not present

## 2021-10-10 DIAGNOSIS — E782 Mixed hyperlipidemia: Secondary | ICD-10-CM | POA: Diagnosis not present

## 2021-10-10 DIAGNOSIS — E1169 Type 2 diabetes mellitus with other specified complication: Secondary | ICD-10-CM | POA: Diagnosis not present

## 2021-10-10 DIAGNOSIS — Z789 Other specified health status: Secondary | ICD-10-CM | POA: Diagnosis not present

## 2021-10-10 DIAGNOSIS — Z23 Encounter for immunization: Secondary | ICD-10-CM | POA: Diagnosis not present

## 2021-10-29 DIAGNOSIS — Z1231 Encounter for screening mammogram for malignant neoplasm of breast: Secondary | ICD-10-CM | POA: Diagnosis not present

## 2021-11-28 DIAGNOSIS — M1990 Unspecified osteoarthritis, unspecified site: Secondary | ICD-10-CM | POA: Diagnosis not present

## 2021-11-28 DIAGNOSIS — R5381 Other malaise: Secondary | ICD-10-CM | POA: Diagnosis not present

## 2021-11-28 DIAGNOSIS — K0510 Chronic gingivitis, plaque induced: Secondary | ICD-10-CM | POA: Diagnosis not present

## 2021-11-28 DIAGNOSIS — E039 Hypothyroidism, unspecified: Secondary | ICD-10-CM | POA: Diagnosis not present

## 2021-11-28 DIAGNOSIS — M810 Age-related osteoporosis without current pathological fracture: Secondary | ICD-10-CM | POA: Diagnosis not present

## 2021-11-28 DIAGNOSIS — Z9181 History of falling: Secondary | ICD-10-CM | POA: Diagnosis not present

## 2022-01-30 DIAGNOSIS — G4733 Obstructive sleep apnea (adult) (pediatric): Secondary | ICD-10-CM | POA: Diagnosis not present

## 2022-02-05 DIAGNOSIS — E1169 Type 2 diabetes mellitus with other specified complication: Secondary | ICD-10-CM | POA: Diagnosis not present

## 2022-02-05 DIAGNOSIS — E039 Hypothyroidism, unspecified: Secondary | ICD-10-CM | POA: Diagnosis not present

## 2022-02-18 DIAGNOSIS — Z1389 Encounter for screening for other disorder: Secondary | ICD-10-CM | POA: Diagnosis not present

## 2022-02-18 DIAGNOSIS — Z0000 Encounter for general adult medical examination without abnormal findings: Secondary | ICD-10-CM | POA: Diagnosis not present

## 2022-02-18 DIAGNOSIS — E669 Obesity, unspecified: Secondary | ICD-10-CM | POA: Diagnosis not present

## 2022-02-18 DIAGNOSIS — E7800 Pure hypercholesterolemia, unspecified: Secondary | ICD-10-CM | POA: Diagnosis not present

## 2022-02-18 DIAGNOSIS — M810 Age-related osteoporosis without current pathological fracture: Secondary | ICD-10-CM | POA: Diagnosis not present

## 2022-02-18 DIAGNOSIS — I10 Essential (primary) hypertension: Secondary | ICD-10-CM | POA: Diagnosis not present

## 2022-02-18 DIAGNOSIS — E039 Hypothyroidism, unspecified: Secondary | ICD-10-CM | POA: Diagnosis not present

## 2022-02-18 DIAGNOSIS — E1169 Type 2 diabetes mellitus with other specified complication: Secondary | ICD-10-CM | POA: Diagnosis not present

## 2022-02-18 DIAGNOSIS — K0510 Chronic gingivitis, plaque induced: Secondary | ICD-10-CM | POA: Diagnosis not present

## 2022-02-18 DIAGNOSIS — G4730 Sleep apnea, unspecified: Secondary | ICD-10-CM | POA: Diagnosis not present

## 2022-02-18 DIAGNOSIS — J3489 Other specified disorders of nose and nasal sinuses: Secondary | ICD-10-CM | POA: Diagnosis not present

## 2022-02-18 DIAGNOSIS — Z03818 Encounter for observation for suspected exposure to other biological agents ruled out: Secondary | ICD-10-CM | POA: Diagnosis not present

## 2022-06-12 DIAGNOSIS — M5416 Radiculopathy, lumbar region: Secondary | ICD-10-CM | POA: Diagnosis not present

## 2022-06-18 ENCOUNTER — Emergency Department (HOSPITAL_BASED_OUTPATIENT_CLINIC_OR_DEPARTMENT_OTHER): Payer: PPO | Admitting: Radiology

## 2022-06-18 ENCOUNTER — Emergency Department (HOSPITAL_BASED_OUTPATIENT_CLINIC_OR_DEPARTMENT_OTHER)
Admission: EM | Admit: 2022-06-18 | Discharge: 2022-06-18 | Disposition: A | Discharge status: Home / Self Care | Payer: PPO | Attending: Emergency Medicine | Admitting: Emergency Medicine

## 2022-06-18 ENCOUNTER — Encounter (HOSPITAL_BASED_OUTPATIENT_CLINIC_OR_DEPARTMENT_OTHER): Payer: Self-pay | Admitting: Emergency Medicine

## 2022-06-18 ENCOUNTER — Other Ambulatory Visit: Payer: Self-pay

## 2022-06-18 DIAGNOSIS — M1612 Unilateral primary osteoarthritis, left hip: Secondary | ICD-10-CM | POA: Diagnosis not present

## 2022-06-18 DIAGNOSIS — M5442 Lumbago with sciatica, left side: Secondary | ICD-10-CM | POA: Diagnosis not present

## 2022-06-18 DIAGNOSIS — M545 Low back pain, unspecified: Secondary | ICD-10-CM | POA: Diagnosis not present

## 2022-06-18 DIAGNOSIS — I10 Essential (primary) hypertension: Secondary | ICD-10-CM | POA: Insufficient documentation

## 2022-06-18 DIAGNOSIS — Z79899 Other long term (current) drug therapy: Secondary | ICD-10-CM | POA: Diagnosis not present

## 2022-06-18 DIAGNOSIS — E039 Hypothyroidism, unspecified: Secondary | ICD-10-CM | POA: Insufficient documentation

## 2022-06-18 DIAGNOSIS — M25552 Pain in left hip: Secondary | ICD-10-CM | POA: Diagnosis not present

## 2022-06-18 DIAGNOSIS — M5432 Sciatica, left side: Secondary | ICD-10-CM | POA: Insufficient documentation

## 2022-06-18 DIAGNOSIS — M5136 Other intervertebral disc degeneration, lumbar region: Secondary | ICD-10-CM | POA: Diagnosis not present

## 2022-06-18 MED ORDER — PREDNISONE 50 MG PO TABS
60.0000 mg | ORAL_TABLET | Freq: Once | ORAL | Status: AC
  Administered 2022-06-18: 60 mg via ORAL
  Filled 2022-06-18: qty 1

## 2022-06-18 MED ORDER — PREDNISONE 10 MG PO TABS: 40.0000 mg | ORAL_TABLET | Freq: Every day | ORAL | 0 refills | Status: DC

## 2022-06-18 MED ORDER — TRAMADOL HCL 50 MG PO TABS: 50.0000 mg | ORAL_TABLET | Freq: Four times a day (QID) | ORAL | 0 refills | Status: DC | PRN

## 2022-06-27 DIAGNOSIS — Z96653 Presence of artificial knee joint, bilateral: Secondary | ICD-10-CM | POA: Diagnosis not present

## 2022-06-27 DIAGNOSIS — Z96652 Presence of left artificial knee joint: Secondary | ICD-10-CM | POA: Diagnosis not present

## 2022-06-27 DIAGNOSIS — M545 Low back pain, unspecified: Secondary | ICD-10-CM | POA: Diagnosis not present

## 2022-06-27 DIAGNOSIS — Z96651 Presence of right artificial knee joint: Secondary | ICD-10-CM | POA: Diagnosis not present

## 2022-07-04 DIAGNOSIS — M5459 Other low back pain: Secondary | ICD-10-CM | POA: Diagnosis not present

## 2022-07-15 DIAGNOSIS — M5459 Other low back pain: Secondary | ICD-10-CM | POA: Diagnosis not present

## 2022-07-24 DIAGNOSIS — M5451 Vertebrogenic low back pain: Secondary | ICD-10-CM | POA: Diagnosis not present

## 2022-07-31 DIAGNOSIS — H524 Presbyopia: Secondary | ICD-10-CM | POA: Diagnosis not present

## 2022-07-31 DIAGNOSIS — H353111 Nonexudative age-related macular degeneration, right eye, early dry stage: Secondary | ICD-10-CM | POA: Diagnosis not present

## 2022-07-31 DIAGNOSIS — H35372 Puckering of macula, left eye: Secondary | ICD-10-CM | POA: Diagnosis not present

## 2022-08-22 DIAGNOSIS — M5416 Radiculopathy, lumbar region: Secondary | ICD-10-CM | POA: Diagnosis not present

## 2022-09-03 DIAGNOSIS — E782 Mixed hyperlipidemia: Secondary | ICD-10-CM | POA: Diagnosis not present

## 2022-09-03 DIAGNOSIS — E039 Hypothyroidism, unspecified: Secondary | ICD-10-CM | POA: Diagnosis not present

## 2022-09-03 DIAGNOSIS — Z23 Encounter for immunization: Secondary | ICD-10-CM | POA: Diagnosis not present

## 2022-09-03 DIAGNOSIS — Z72820 Sleep deprivation: Secondary | ICD-10-CM | POA: Diagnosis not present

## 2022-09-03 DIAGNOSIS — I10 Essential (primary) hypertension: Secondary | ICD-10-CM | POA: Diagnosis not present

## 2022-09-03 DIAGNOSIS — Z6839 Body mass index (BMI) 39.0-39.9, adult: Secondary | ICD-10-CM | POA: Diagnosis not present

## 2022-09-03 DIAGNOSIS — E1169 Type 2 diabetes mellitus with other specified complication: Secondary | ICD-10-CM | POA: Diagnosis not present

## 2022-09-18 DIAGNOSIS — I10 Essential (primary) hypertension: Secondary | ICD-10-CM | POA: Diagnosis not present

## 2022-09-18 DIAGNOSIS — G4733 Obstructive sleep apnea (adult) (pediatric): Secondary | ICD-10-CM | POA: Diagnosis not present

## 2022-09-19 DIAGNOSIS — T50905A Adverse effect of unspecified drugs, medicaments and biological substances, initial encounter: Secondary | ICD-10-CM | POA: Diagnosis not present

## 2022-09-19 DIAGNOSIS — M5416 Radiculopathy, lumbar region: Secondary | ICD-10-CM | POA: Diagnosis not present

## 2022-09-19 DIAGNOSIS — M479 Spondylosis, unspecified: Secondary | ICD-10-CM | POA: Diagnosis not present

## 2022-10-02 DIAGNOSIS — H04123 Dry eye syndrome of bilateral lacrimal glands: Secondary | ICD-10-CM | POA: Diagnosis not present

## 2022-10-02 DIAGNOSIS — H35372 Puckering of macula, left eye: Secondary | ICD-10-CM | POA: Diagnosis not present

## 2022-10-02 DIAGNOSIS — H353111 Nonexudative age-related macular degeneration, right eye, early dry stage: Secondary | ICD-10-CM | POA: Diagnosis not present

## 2022-10-02 DIAGNOSIS — H26493 Other secondary cataract, bilateral: Secondary | ICD-10-CM | POA: Diagnosis not present

## 2022-10-24 DIAGNOSIS — H04123 Dry eye syndrome of bilateral lacrimal glands: Secondary | ICD-10-CM | POA: Diagnosis not present

## 2022-10-24 DIAGNOSIS — H26492 Other secondary cataract, left eye: Secondary | ICD-10-CM | POA: Diagnosis not present

## 2022-10-24 DIAGNOSIS — H43813 Vitreous degeneration, bilateral: Secondary | ICD-10-CM | POA: Diagnosis not present

## 2022-10-24 DIAGNOSIS — H26493 Other secondary cataract, bilateral: Secondary | ICD-10-CM | POA: Diagnosis not present

## 2022-10-30 DIAGNOSIS — Z1231 Encounter for screening mammogram for malignant neoplasm of breast: Secondary | ICD-10-CM | POA: Diagnosis not present

## 2022-12-02 DIAGNOSIS — G4733 Obstructive sleep apnea (adult) (pediatric): Secondary | ICD-10-CM | POA: Diagnosis not present

## 2022-12-11 DIAGNOSIS — I10 Essential (primary) hypertension: Secondary | ICD-10-CM | POA: Diagnosis not present

## 2022-12-11 DIAGNOSIS — R220 Localized swelling, mass and lump, head: Secondary | ICD-10-CM | POA: Diagnosis not present

## 2022-12-11 DIAGNOSIS — M810 Age-related osteoporosis without current pathological fracture: Secondary | ICD-10-CM | POA: Diagnosis not present

## 2023-01-01 DIAGNOSIS — G4733 Obstructive sleep apnea (adult) (pediatric): Secondary | ICD-10-CM | POA: Diagnosis not present

## 2023-02-01 DIAGNOSIS — G4733 Obstructive sleep apnea (adult) (pediatric): Secondary | ICD-10-CM | POA: Diagnosis not present

## 2023-03-02 DIAGNOSIS — G4733 Obstructive sleep apnea (adult) (pediatric): Secondary | ICD-10-CM | POA: Diagnosis not present

## 2023-03-05 DIAGNOSIS — G4733 Obstructive sleep apnea (adult) (pediatric): Secondary | ICD-10-CM | POA: Diagnosis not present

## 2023-03-05 DIAGNOSIS — I10 Essential (primary) hypertension: Secondary | ICD-10-CM | POA: Diagnosis not present

## 2023-03-17 DIAGNOSIS — R350 Frequency of micturition: Secondary | ICD-10-CM | POA: Diagnosis not present

## 2023-03-17 DIAGNOSIS — Z6836 Body mass index (BMI) 36.0-36.9, adult: Secondary | ICD-10-CM | POA: Diagnosis not present

## 2023-03-17 DIAGNOSIS — R109 Unspecified abdominal pain: Secondary | ICD-10-CM | POA: Diagnosis not present

## 2023-04-02 DIAGNOSIS — G4733 Obstructive sleep apnea (adult) (pediatric): Secondary | ICD-10-CM | POA: Diagnosis not present

## 2023-04-08 DIAGNOSIS — G4733 Obstructive sleep apnea (adult) (pediatric): Secondary | ICD-10-CM | POA: Diagnosis not present

## 2023-04-09 DIAGNOSIS — M25512 Pain in left shoulder: Secondary | ICD-10-CM | POA: Diagnosis not present

## 2023-04-09 DIAGNOSIS — E039 Hypothyroidism, unspecified: Secondary | ICD-10-CM | POA: Diagnosis not present

## 2023-04-09 DIAGNOSIS — L602 Onychogryphosis: Secondary | ICD-10-CM | POA: Diagnosis not present

## 2023-04-09 DIAGNOSIS — E782 Mixed hyperlipidemia: Secondary | ICD-10-CM | POA: Diagnosis not present

## 2023-04-09 DIAGNOSIS — I10 Essential (primary) hypertension: Secondary | ICD-10-CM | POA: Diagnosis not present

## 2023-04-09 DIAGNOSIS — L03039 Cellulitis of unspecified toe: Secondary | ICD-10-CM | POA: Diagnosis not present

## 2023-04-09 DIAGNOSIS — E119 Type 2 diabetes mellitus without complications: Secondary | ICD-10-CM | POA: Diagnosis not present

## 2023-04-09 DIAGNOSIS — Z6836 Body mass index (BMI) 36.0-36.9, adult: Secondary | ICD-10-CM | POA: Diagnosis not present

## 2023-04-21 NOTE — Therapy (Unsigned)
OUTPATIENT PHYSICAL THERAPY SHOULDER EVALUATION   Patient Name: Mary Hayes MRN: [id number] DOB:20-Apr-1941, 82 y.o., female Today's Date: 04/23/2023  END OF SESSION:  PT End of Session - 04/23/23 1020     Visit Number 1    Date for PT Re-Evaluation 07/02/23    PT Start Time 1022    PT Stop Time 1050    PT Time Calculation (min) 28 min    Activity Tolerance Patient tolerated treatment well    Behavior During Therapy WFL for tasks assessed/performed             Past Medical History:  Diagnosis Date   Arthritis    Hypercholesteremia    Hypertension    Hypothyroidism    Sleep apnea    cpap   Past Surgical History:  Procedure Laterality Date   HAND SURGERY     right hand pinched nerve   JOINT REPLACEMENT Right    Knee   TONSILLECTOMY     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   Procedure: TOTAL KNEE ARTHROPLASTY;  Surgeon: Ollen Gross, MD;  Location: [facility];  Service: Orthopedics;  Laterality: Left;    Patient Active Problem List   Diagnosis Date Noted   OA (osteoarthritis) of knee 01/11/2019    PCP: [facility]  REFERRING PROVIDER: Jackelyn Poling, DO  REFERRING DIAG: [phone] (ICD-10-CM) - Pain in left shoulder   THERAPY DIAG:  Abnormal posture  Muscle weakness (generalized)  Other lack of coordination  Chronic pain in left shoulder  Rationale for Evaluation and Treatment: Rehabilitation  ONSET DATE: 04/09/23  SUBJECTIVE:                                                                                                                                                                                      SUBJECTIVE STATEMENT: Patient reports L shoulder pain. Occurred over time. Started before Christmas, she cannot elevate her shoulder at times. She reports a lot of arthritis. R shoulder hurts as well, but not as bad. Hand dominance: Left  PERTINENT HISTORY: Per referring Practitioner note: DM, Hypothyroidism Chronic L shoulder pain,  exacerbated, decreased abd, TTP biceps tendon and post shoulder, (+) Yergason's and empty can-suspect RC injury.  PAIN:  Are you having pain? Yes: NPRS scale: 7/10 Pain location: L shoulder Pain description: sharp Aggravating factors: elevation, reaching behind back Relieving factors: Pain meds help a bit, but make her sleepy.  PRECAUTIONS: None  WEIGHT BEARING RESTRICTIONS: No  FALLS:  Has patient fallen in last 6 months? No  LIVING ENVIRONMENT: Lives with: lives alone Lives in: House/apartment Stairs: Yes: External: 12 steps; can reach both Has following equipment at home: None  OCCUPATION: N/A-Interferes in housework.   PLOF: Independent  PATIENT GOALS:Decreased pain. Be able to do her housework without pain.  NEXT MD VISIT:   OBJECTIVE:   DIAGNOSTIC FINDINGS:  N/A  COGNITION: Overall cognitive status: Within functional limits for tasks assessed     SENSATION: Patient reports T & N in hands, chronic  POSTURE: Flattened thoracic and lumbar curves, round shoulders  UPPER EXTREMITY ROM: R shoulder mildly limited. B hands limited due to arthritis.  Passive ROM Right eval Left eval  Shoulder flexion  110  Shoulder extension    Shoulder abduction  118  Shoulder adduction    Shoulder internal rotation  55  Shoulder external rotation  33  Elbow flexion    Elbow extension    Wrist flexion    Wrist extension    Wrist ulnar deviation    Wrist radial deviation    Wrist pronation    Wrist supination    (Blank rows = not tested)  UPPER EXTREMITY MMT: L shoulder movement testing all limited due to pain with resistance. Appears to be approximately 3+/5. R shoulder 3+/5, elbows 4/5.  SHOULDER SPECIAL TESTS: Impingement tests: Neer impingement test: positive  and Painful arc test: positive  Rotator cuff assessment: Empty can test: positive  and Belly press test: negative Biceps assessment: Yergason's test: positive  and Speed's test: positive   JOINT MOBILITY  TESTING:  L humeral inf glide and post glide WNL  PALPATION:  TTP in L UT, rhomboids, posterior shoulder joint capsule, long head of biceps  CERVICAL ROM:   Flexion, R and L lateral flexion all limited due to pain, ext, B rotation WFL   TODAY'S TREATMENT:                                                                                                                                         DATE:  04/23/23 Education  PATIENT EDUCATION: Education details: POC Person educated: Patient, grandson Education method: Explanation Education comprehension: verbalized understanding  HOME EXERCISE PROGRAM: TBD  ASSESSMENT:  CLINICAL IMPRESSION: Patient is a 82 y.o. who was seen today for physical therapy evaluation and treatment for L shoulder pain. The pain started around Christmas time. Hurts with elevation, reaching behind her back, limiting her activity. Her R shoulder hurts as well, and she has a lot of arthritis all through her body, which appears to be contributing. Her ROM and strength are both limited by pain. She shows some postural instability as well. She will benefit from PT to address her acute pain as well as provide ROM and strengthening to trunk and shoulder in order to improve her posture and her stability in her L shoulder to help manage her pain and return to her normal daily activities.  OBJECTIVE IMPAIRMENTS: decreased activity tolerance, decreased mobility, decreased ROM, decreased strength, impaired flexibility, impaired UE functional use, improper body mechanics, postural dysfunction, and pain.  ACTIVITY LIMITATIONS: carrying, lifting, reach over head, and hygiene/grooming  PARTICIPATION LIMITATIONS: meal prep, cleaning, laundry, shopping, and community activity  PERSONAL FACTORS: Age and Past/current experiences are also affecting patient's functional outcome.   REHAB POTENTIAL: Good  CLINICAL DECISION MAKING: Stable/uncomplicated  EVALUATION COMPLEXITY:  Low   GOALS: Goals reviewed with patient? Yes  SHORT TERM GOALS: Target date: 05/10/23  I with initial HEP Baseline: Goal status: INITIAL  LONG TERM GOALS: Target date: 07/02/23  I with final HEP Baseline:  Goal status: INITIAL  2.  Improve L shoulder PROM to at least equal to her R Baseline: flex 110, abd 118, IR 33 Goal status: INITIAL  3.  Increase painfree L shoulder strength to at least 4/5, including scapula, in order to improve postural support and control. Baseline:  Goal status: INITIAL  4.  Patient will report return to her normal daily activities, including cleaning, with L shoulder pain < 3/10 Baseline: 7/10 Goal status: INITIAL PLAN:  PT FREQUENCY: 1-2x/week  PT DURATION: 10 weeks  PLANNED INTERVENTIONS: Therapeutic exercises, Therapeutic activity, Neuromuscular re-education, Balance training, Gait training, Patient/Family education, Self Care, Joint mobilization, Dry Needling, Electrical stimulation, Spinal mobilization, Cryotherapy, Moist heat, Taping, Vasopneumatic device, Ionotophoresis 4mg /ml Dexamethasone, and Manual therapy  PLAN FOR NEXT SESSION: Initiate HEP   Iona Beard, DPT 04/23/2023, 11:09 AM

## 2023-04-23 ENCOUNTER — Encounter: Payer: Self-pay | Admitting: Physical Therapy

## 2023-04-23 ENCOUNTER — Ambulatory Visit: Payer: PPO | Attending: Family Medicine | Admitting: Physical Therapy

## 2023-04-23 DIAGNOSIS — G8929 Other chronic pain: Secondary | ICD-10-CM | POA: Diagnosis not present

## 2023-04-23 DIAGNOSIS — R293 Abnormal posture: Secondary | ICD-10-CM | POA: Diagnosis not present

## 2023-04-23 DIAGNOSIS — M25512 Pain in left shoulder: Secondary | ICD-10-CM | POA: Diagnosis not present

## 2023-04-23 DIAGNOSIS — R278 Other lack of coordination: Secondary | ICD-10-CM | POA: Diagnosis not present

## 2023-04-23 DIAGNOSIS — M6281 Muscle weakness (generalized): Secondary | ICD-10-CM | POA: Diagnosis not present

## 2023-04-28 ENCOUNTER — Ambulatory Visit: Payer: PPO | Admitting: Physical Therapy

## 2023-04-28 ENCOUNTER — Encounter: Payer: Self-pay | Admitting: Physical Therapy

## 2023-04-28 DIAGNOSIS — R293 Abnormal posture: Secondary | ICD-10-CM

## 2023-04-28 DIAGNOSIS — R278 Other lack of coordination: Secondary | ICD-10-CM

## 2023-04-28 DIAGNOSIS — G8929 Other chronic pain: Secondary | ICD-10-CM

## 2023-04-28 DIAGNOSIS — M6281 Muscle weakness (generalized): Secondary | ICD-10-CM

## 2023-04-28 NOTE — Therapy (Signed)
OUTPATIENT PHYSICAL THERAPY SHOULDER TREATMENT   Patient Name: Mary Hayes MRN: [id number] DOB:1941/01/24, 82 y.o., female Today's Date: 04/28/2023  END OF SESSION:  PT End of Session - 04/28/23 1344     Visit Number 2    Date for PT Re-Evaluation 07/02/23    PT Start Time 1345    PT Stop Time 1430    PT Time Calculation (min) 45 min    Activity Tolerance Patient tolerated treatment well    Behavior During Therapy WFL for tasks assessed/performed             Past Medical History:  Diagnosis Date   Arthritis    Hypercholesteremia    Hypertension    Hypothyroidism    Sleep apnea    cpap   Past Surgical History:  Procedure Laterality Date   HAND SURGERY     right hand pinched nerve   JOINT REPLACEMENT Right    Knee   TONSILLECTOMY     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   Procedure: TOTAL KNEE ARTHROPLASTY;  Surgeon: Ollen Gross, MD;  Location: [facility];  Service: Orthopedics;  Laterality: Left;    Patient Active Problem List   Diagnosis Date Noted   OA (osteoarthritis) of knee 01/11/2019    PCP: [facility]  REFERRING PROVIDER: Jackelyn Poling, DO  REFERRING DIAG: [phone] (ICD-10-CM) - Pain in left shoulder   THERAPY DIAG:  Abnormal posture  Muscle weakness (generalized)  Other lack of coordination  Chronic pain in left shoulder  Rationale for Evaluation and Treatment: Rehabilitation  ONSET DATE: 04/09/23  SUBJECTIVE:                                                                                                                                                                                      SUBJECTIVE STATEMENT: "I feel ok" "Some times I can move" Hand dominance: Left  PERTINENT HISTORY: Per referring Practitioner note: DM, Hypothyroidism Chronic L shoulder pain, exacerbated, decreased abd, TTP biceps tendon and post shoulder, (+) Yergason's and empty can-suspect RC injury.  PAIN:  Are you having pain? Yes: NPRS scale:  8/10 Pain location: L shoulder Pain description: sharp Aggravating factors: elevation, reaching behind back Relieving factors: Pain meds help a bit, but make her sleepy.  PRECAUTIONS: None  WEIGHT BEARING RESTRICTIONS: No  FALLS:  Has patient fallen in last 6 months? No  LIVING ENVIRONMENT: Lives with: lives alone Lives in: House/apartment Stairs: Yes: External: 12 steps; can reach both Has following equipment at home: None  OCCUPATION: N/A-Interferes in housework.   PLOF: Independent  PATIENT GOALS:Decreased pain. Be able to do her housework without pain.  NEXT MD VISIT:  OBJECTIVE:   DIAGNOSTIC FINDINGS:  N/A  COGNITION: Overall cognitive status: Within functional limits for tasks assessed     SENSATION: Patient reports T & N in hands, chronic  POSTURE: Flattened thoracic and lumbar curves, round shoulders  UPPER EXTREMITY ROM: R shoulder mildly limited. B hands limited due to arthritis.  Passive ROM Right eval Left eval  Shoulder flexion  110  Shoulder extension    Shoulder abduction  118  Shoulder adduction    Shoulder internal rotation  55  Shoulder external rotation  33  Elbow flexion    Elbow extension    Wrist flexion    Wrist extension    Wrist ulnar deviation    Wrist radial deviation    Wrist pronation    Wrist supination    (Blank rows = not tested)  UPPER EXTREMITY MMT: L shoulder movement testing all limited due to pain with resistance. Appears to be approximately 3+/5. R shoulder 3+/5, elbows 4/5.  SHOULDER SPECIAL TESTS: Impingement tests: Neer impingement test: positive  and Painful arc test: positive  Rotator cuff assessment: Empty can test: positive  and Belly press test: negative Biceps assessment: Yergason's test: positive  and Speed's test: positive   JOINT MOBILITY TESTING:  L humeral inf glide and post glide WNL  PALPATION:  TTP in L UT, rhomboids, posterior shoulder joint capsule, long head of biceps  CERVICAL ROM:    Flexion, R and L lateral flexion all limited due to pain, ext, B rotation WFL   TODAY'S TREATMENT:                                                                                                                                         DATE:  04/27/25 AAROM Cane Flex, Ext, IR up back x10 Rows & Ext yellow 2x10 LUE ER AROM 2x10 LUE IR yellow 2x10  Biceps curle 3lb WaTE 2x10 2lb shrugs x10 NuStpe L 4 x 5 min  LUE PROM in all directions  04/23/23 Education  PATIENT EDUCATION: Education details: POC Person educated: Patient, grandson Education method: Explanation Education comprehension: verbalized understanding  HOME EXERCISE PROGRAM: TBD  ASSESSMENT:  CLINICAL IMPRESSION: Patient is a 82 y.o. who was seen today for physical  treatment for L shoulder pain.Pt enters doing well but continues to have L shoulder pain tahat increases with movent above shoulder.  Session consisted of light L shoulder strength and ROM. Pain reported with motions above shoulder height. Postural cues needed with rows and extensions. Tactiel cue to L elbow needed with external and internal rotation. She will benefit from PT to address her acute pain as well as provide ROM and strengthening to trunk and shoulder in order to improve her posture and her stability in her L shoulder to help manage her pain and return to her normal daily activities.  OBJECTIVE IMPAIRMENTS: decreased activity tolerance, decreased mobility, decreased ROM, decreased strength, impaired flexibility,  impaired UE functional use, improper body mechanics, postural dysfunction, and pain.   ACTIVITY LIMITATIONS: carrying, lifting, reach over head, and hygiene/grooming  PARTICIPATION LIMITATIONS: meal prep, cleaning, laundry, shopping, and community activity  PERSONAL FACTORS: Age and Past/current experiences are also affecting patient's functional outcome.   REHAB POTENTIAL: Good  CLINICAL DECISION MAKING: Stable/uncomplicated  EVALUATION  COMPLEXITY: Low   GOALS: Goals reviewed with patient? Yes  SHORT TERM GOALS: Target date: 05/10/23  I with initial HEP Baseline: Goal status: INITIAL  LONG TERM GOALS: Target date: 07/02/23  I with final HEP Baseline:  Goal status: INITIAL  2.  Improve L shoulder PROM to at least equal to her R Baseline: flex 110, abd 118, IR 33 Goal status: INITIAL  3.  Increase painfree L shoulder strength to at least 4/5, including scapula, in order to improve postural support and control. Baseline:  Goal status: INITIAL  4.  Patient will report return to her normal daily activities, including cleaning, with L shoulder pain < 3/10 Baseline: 7/10 Goal status: INITIAL PLAN:  PT FREQUENCY: 1-2x/week  PT DURATION: 10 weeks  PLANNED INTERVENTIONS: Therapeutic exercises, Therapeutic activity, Neuromuscular re-education, Balance training, Gait training, Patient/Family education, Self Care, Joint mobilization, Dry Needling, Electrical stimulation, Spinal mobilization, Cryotherapy, Moist heat, Taping, Vasopneumatic device, Ionotophoresis 4mg /ml Dexamethasone, and Manual therapy  PLAN FOR NEXT SESSION: Initiate HEP   Iona Beard, DPT 04/28/2023, 1:45 PM

## 2023-04-30 ENCOUNTER — Ambulatory Visit: Payer: PPO | Admitting: Podiatry

## 2023-05-01 ENCOUNTER — Ambulatory Visit: Payer: PPO | Admitting: Physical Therapy

## 2023-05-01 ENCOUNTER — Encounter: Payer: Self-pay | Admitting: Physical Therapy

## 2023-05-01 DIAGNOSIS — G8929 Other chronic pain: Secondary | ICD-10-CM

## 2023-05-01 DIAGNOSIS — R293 Abnormal posture: Secondary | ICD-10-CM | POA: Diagnosis not present

## 2023-05-01 DIAGNOSIS — R278 Other lack of coordination: Secondary | ICD-10-CM

## 2023-05-01 DIAGNOSIS — M6281 Muscle weakness (generalized): Secondary | ICD-10-CM

## 2023-05-01 NOTE — Therapy (Signed)
OUTPATIENT PHYSICAL THERAPY SHOULDER TREATMENT   Patient Name: Mary Hayes MRN: [id number] DOB:04/15/1941, 82 y.o., female Today's Date: 05/01/2023  END OF SESSION:  PT End of Session - 05/01/23 1449     Visit Number 3    Date for PT Re-Evaluation 07/02/23    PT Start Time 1446    PT Stop Time 1525    PT Time Calculation (min) 39 min    Activity Tolerance Patient tolerated treatment well    Behavior During Therapy WFL for tasks assessed/performed             Past Medical History:  Diagnosis Date   Arthritis    Hypercholesteremia    Hypertension    Hypothyroidism    Sleep apnea    cpap   Past Surgical History:  Procedure Laterality Date   HAND SURGERY     right hand pinched nerve   JOINT REPLACEMENT Right    Knee   TONSILLECTOMY     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   Procedure: TOTAL KNEE ARTHROPLASTY;  Surgeon: Ollen Gross, MD;  Location: [facility];  Service: Orthopedics;  Laterality: Left;    Patient Active Problem List   Diagnosis Date Noted   OA (osteoarthritis) of knee 01/11/2019    PCP: [facility]  REFERRING PROVIDER: Jackelyn Poling, DO  REFERRING DIAG: [phone] (ICD-10-CM) - Pain in left shoulder   THERAPY DIAG:  Abnormal posture  Muscle weakness (generalized)  Chronic pain in left shoulder  Other lack of coordination  Rationale for Evaluation and Treatment: Rehabilitation  ONSET DATE: 04/09/23  SUBJECTIVE:                                                                                                                                                                                      SUBJECTIVE STATEMENT: Patient reports some soreness  Hand dominance: Left  PERTINENT HISTORY: Per referring Practitioner note: DM, Hypothyroidism Chronic L shoulder pain, exacerbated, decreased abd, TTP biceps tendon and post shoulder, (+) Yergason's and empty can-suspect RC injury.  PAIN:  Are you having pain? Yes: NPRS scale:  8/10 Pain location: L shoulder Pain description: sharp Aggravating factors: elevation, reaching behind back Relieving factors: Pain meds help a bit, but make her sleepy.  PRECAUTIONS: None  WEIGHT BEARING RESTRICTIONS: No  FALLS:  Has patient fallen in last 6 months? No  LIVING ENVIRONMENT: Lives with: lives alone Lives in: House/apartment Stairs: Yes: External: 12 steps; can reach both Has following equipment at home: None  OCCUPATION: N/A-Interferes in housework.   PLOF: Independent  PATIENT GOALS:Decreased pain. Be able to do her housework without pain.  NEXT MD VISIT:   OBJECTIVE:  DIAGNOSTIC FINDINGS:  N/A  COGNITION: Overall cognitive status: Within functional limits for tasks assessed     SENSATION: Patient reports T & N in hands, chronic  POSTURE: Flattened thoracic and lumbar curves, round shoulders  UPPER EXTREMITY ROM: R shoulder mildly limited. B hands limited due to arthritis.  Passive ROM Right eval Left eval  Shoulder flexion  110  Shoulder extension    Shoulder abduction  118  Shoulder adduction    Shoulder internal rotation  55  Shoulder external rotation  33  Elbow flexion    Elbow extension    Wrist flexion    Wrist extension    Wrist ulnar deviation    Wrist radial deviation    Wrist pronation    Wrist supination    (Blank rows = not tested)  UPPER EXTREMITY MMT: L shoulder movement testing all limited due to pain with resistance. Appears to be approximately 3+/5. R shoulder 3+/5, elbows 4/5.  SHOULDER SPECIAL TESTS: Impingement tests: Neer impingement test: positive  and Painful arc test: positive  Rotator cuff assessment: Empty can test: positive  and Belly press test: negative Biceps assessment: Yergason's test: positive  and Speed's test: positive   JOINT MOBILITY TESTING:  L humeral inf glide and post glide WNL  PALPATION:  TTP in L UT, rhomboids, posterior shoulder joint capsule, long head of biceps  CERVICAL ROM:    Flexion, R and L lateral flexion all limited due to pain, ext, B rotation WFL   TODAY'S TREATMENT:                                                                                                                                         DATE:  05/01/23 UBE 2:30 forward, but then patient reported pain, so stopped. STM-Patient reports pain with shoulder elevation. She reports chronic neck pain as well.  Palpation- TTP and very tight throughout entire upper trunk, including cervical paraspinals, scalenes, rhomboids, around entire circumference of L scapular, Pects, and biceps tendon.  Therapist attempted STM to the area, she tolerated some of the treatment, at other times winced. Moved to scapular mobilizations and Pect stretch with same results, some relief, but minimal.  Passive shoulder flexion in sit with facilitation to L scap. Able to achieve > 90 with no report of pain, but on second attempt, patient and therapist both felt multiple pops, unable to identify exact location. She reports popping in both her neck and L shoulder when trying to move. Initiated active shoulder elevation with gentle wall slides into flexion. She was able to perform to just above 90 flexion, but then felt the popping. Attempted pendulum exercises, but she could not relax sufficiently to achieve true pendulum. Offered CP for inflammation after the STM, but patient declined.  04/27/25 AAROM Cane Flex, Ext, IR up back x10 Rows & Ext yellow 2x10 LUE ER AROM 2x10 LUE IR yellow 2x10  Biceps  curle 3lb WaTE 2x10 2lb shrugs x10 NuStpe L 4 x 5 min  LUE PROM in all directions  04/23/23 Education  PATIENT EDUCATION: Education details: POC Person educated: Patient, grandson Education method: Explanation Education comprehension: verbalized understanding  HOME EXERCISE PROGRAM: TBD  ASSESSMENT:  CLINICAL IMPRESSION: Patient arrived reporting continued pain, very limited use of LUE. Attempted multiple treatment  approaches, mostly with minimal tolerance and improvement of pain. Also noted some popping in L shoulder when she attempted elevation both P and A. Will continue to assess pain while addressing her overall tightness in her entire upper trunk and shoulder. May benefit from e stim and heat in order to decrease her guarding and muscle spasm.  OBJECTIVE IMPAIRMENTS: decreased activity tolerance, decreased mobility, decreased ROM, decreased strength, impaired flexibility, impaired UE functional use, improper body mechanics, postural dysfunction, and pain.   ACTIVITY LIMITATIONS: carrying, lifting, reach over head, and hygiene/grooming  PARTICIPATION LIMITATIONS: meal prep, cleaning, laundry, shopping, and community activity  PERSONAL FACTORS: Age and Past/current experiences are also affecting patient's functional outcome.   REHAB POTENTIAL: Good  CLINICAL DECISION MAKING: Stable/uncomplicated  EVALUATION COMPLEXITY: Low   GOALS: Goals reviewed with patient? Yes  SHORT TERM GOALS: Target date: 05/10/23  I with initial HEP Baseline: Goal status: 05/01/23-Attempting to identify appropriate exercises.  LONG TERM GOALS: Target date: 07/02/23  I with final HEP Baseline:  Goal status: INITIAL  2.  Improve L shoulder PROM to at least equal to her R Baseline: flex 110, abd 118, IR 33 Goal status: INITIAL  3.  Increase painfree L shoulder strength to at least 4/5, including scapula, in order to improve postural support and control. Baseline:  Goal status: INITIAL  4.  Patient will report return to her normal daily activities, including cleaning, with L shoulder pain < 3/10 Baseline: 7/10 Goal status: INITIAL PLAN:  PT FREQUENCY: 1-2x/week  PT DURATION: 10 weeks  PLANNED INTERVENTIONS: Therapeutic exercises, Therapeutic activity, Neuromuscular re-education, Balance training, Gait training, Patient/Family education, Self Care, Joint mobilization, Dry Needling, Electrical stimulation,  Spinal mobilization, Cryotherapy, Moist heat, Taping, Vasopneumatic device, Ionotophoresis 4mg /ml Dexamethasone, and Manual therapy  PLAN FOR NEXT SESSION: Initiate HEP   Iona Beard, DPT 05/01/2023, 4:01 PM

## 2023-05-02 DIAGNOSIS — G4733 Obstructive sleep apnea (adult) (pediatric): Secondary | ICD-10-CM | POA: Diagnosis not present

## 2023-05-05 ENCOUNTER — Ambulatory Visit: Payer: PPO | Admitting: Physical Therapy

## 2023-05-05 ENCOUNTER — Encounter: Payer: Self-pay | Admitting: Physical Therapy

## 2023-05-05 DIAGNOSIS — R293 Abnormal posture: Secondary | ICD-10-CM

## 2023-05-05 DIAGNOSIS — M6281 Muscle weakness (generalized): Secondary | ICD-10-CM

## 2023-05-05 DIAGNOSIS — G8929 Other chronic pain: Secondary | ICD-10-CM

## 2023-05-05 NOTE — Therapy (Signed)
OUTPATIENT PHYSICAL THERAPY SHOULDER TREATMENT   Patient Name: Mary Hayes MRN: [id number] DOB:30-Jun-1941, 82 y.o., female Today's Date: 05/05/2023  END OF SESSION:  PT End of Session - 05/05/23 1349     Visit Number 4    Date for PT Re-Evaluation 07/02/23    PT Start Time 1345    PT Stop Time 1230    PT Time Calculation (min) 1365 min    Activity Tolerance Patient tolerated treatment well    Behavior During Therapy WFL for tasks assessed/performed             Past Medical History:  Diagnosis Date   Arthritis    Hypercholesteremia    Hypertension    Hypothyroidism    Sleep apnea    cpap   Past Surgical History:  Procedure Laterality Date   HAND SURGERY     right hand pinched nerve   JOINT REPLACEMENT Right    Knee   TONSILLECTOMY     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   Procedure: TOTAL KNEE ARTHROPLASTY;  Surgeon: Ollen Gross, MD;  Location: [facility];  Service: Orthopedics;  Laterality: Left;    Patient Active Problem List   Diagnosis Date Noted   OA (osteoarthritis) of knee 01/11/2019    [facility]P: [facility]  REFERRING PROVIDER: Jackelyn Poling, DO  REFERRING DIAG: [phone] (ICD-10-CM) - Pain in left shoulder   THERAPY DIAG:  Abnormal posture  Muscle weakness (generalized)  Chronic pain in left shoulder  Rationale for Evaluation and Treatment: Rehabilitation  ONSET DATE: 04/09/23  SUBJECTIVE:                                                                                                                                                                                      SUBJECTIVE STATEMENT: Doing ok, moving better. Pt reports that she thinks it changes with the weather  Hand dominance: Left  PERTINENT HISTORY: Per referring Practitioner note: DM, Hypothyroidism Chronic L shoulder pain, exacerbated, decreased abd, TTP biceps tendon and post shoulder, (+) Yergason's and empty can-suspect RC injury.  PAIN:  Are you having  pain? Yes: NPRS scale: 4/10 Pain location: L shoulder Pain description: sharp Aggravating factors: elevation, reaching behind back Relieving factors: Pain meds help a bit, but make her sleepy.  PRECAUTIONS: None  WEIGHT BEARING RESTRICTIONS: No  FALLS:  Has patient fallen in last 6 months? No  LIVING ENVIRONMENT: Lives with: lives alone Lives in: House/apartment Stairs: Yes: External: 12 steps; can reach both Has following equipment at home: None  OCCUPATION: N/A-Interferes in housework.   PLOF: Independent  PATIENT GOALS:Decreased pain. Be able to do her housework without pain.  NEXT  MD VISIT:   OBJECTIVE:   DIAGNOSTIC FINDINGS:  N/A  COGNITION: Overall cognitive status: Within functional limits for tasks assessed     SENSATION: Patient reports T & N in hands, chronic  POSTURE: Flattened thoracic and lumbar curves, round shoulders  UPPER EXTREMITY ROM: R shoulder mildly limited. B hands limited due to arthritis.  Passive ROM Right eval Left eval  Shoulder flexion  110  Shoulder extension    Shoulder abduction  118  Shoulder adduction    Shoulder internal rotation  55  Shoulder external rotation  33  Elbow flexion    Elbow extension    Wrist flexion    Wrist extension    Wrist ulnar deviation    Wrist radial deviation    Wrist pronation    Wrist supination    (Blank rows = not tested)  UPPER EXTREMITY MMT: L shoulder movement testing all limited due to pain with resistance. Appears to be approximately 3+/5. R shoulder 3+/5, elbows 4/5.  SHOULDER SPECIAL TESTS: Impingement tests: Neer impingement test: positive  and Painful arc test: positive  Rotator cuff assessment: Empty can test: positive  and Belly press test: negative Biceps assessment: Yergason's test: positive  and Speed's test: positive   JOINT MOBILITY TESTING:  L humeral inf glide and post glide WNL  PALPATION:  TTP in L UT, rhomboids, posterior shoulder joint capsule, long head of  biceps  CERVICAL ROM:   Flexion, R and L lateral flexion all limited due to pain, ext, B rotation WFL   TODAY'S TREATMENT:                                                                                                                                         DATE:  05/05/23 NuStep L 4 x 6 min AAROM 1lb WaTE  Flex, Ext, IR up back x10 LUE IR red  2x10  LUE ER 2x10 AROM Shoulder abduction 2x10 Rows and Ext green 2x10 L shoulder PROM in al directions, STM to upper L trap   05/01/23 UBE 2:30 forward, but then patient reported pain, so stopped. STM-Patient reports pain with shoulder elevation. She reports chronic neck pain as well.  Palpation- TTP and very tight throughout entire upper trunk, including cervical paraspinals, scalenes, rhomboids, around entire circumference of L scapular, Pects, and biceps tendon.  Therapist attempted STM to the area, she tolerated some of the treatment, at other times winced. Moved to scapular mobilizations and Pect stretch with same results, some relief, but minimal.  Passive shoulder flexion in sit with facilitation to L scap. Able to achieve > 90 with no report of pain, but on second attempt, patient and therapist both felt multiple pops, unable to identify exact location. She reports popping in both her neck and L shoulder when trying to move. Initiated active shoulder elevation with gentle wall slides into flexion. She was able to perform to just above  90 flexion, but then felt the popping. Attempted pendulum exercises, but she could not relax sufficiently to achieve true pendulum. Offered CP for inflammation after the STM, but patient declined.  04/27/25 AAROM Cane Flex, Ext, IR up back x10 Rows & Ext yellow 2x10 LUE ER AROM 2x10 LUE IR yellow 2x10  Biceps curle 3lb WaTE 2x10 2lb shrugs x10 NuStpe L 4 x 5 min  LUE PROM in all directions  04/23/23 Education  PATIENT EDUCATION: Education details: POC Person educated: Patient, grandson Education  method: Explanation Education comprehension: verbalized understanding  HOME EXERCISE PROGRAM: TBD  ASSESSMENT:  CLINICAL IMPRESSION: Patient arrived reporting little pain that changes with the weather.. Popping in L shoulder noted during session when she attempted elevation both P and A. She did well with the active interventions. She was very weak with L shoulder external rotation. Good overall PROM. Will continue to assess pain while addressing her overall tightness in her entire upper trunk and shoulder.   OBJECTIVE IMPAIRMENTS: decreased activity tolerance, decreased mobility, decreased ROM, decreased strength, impaired flexibility, impaired UE functional use, improper body mechanics, postural dysfunction, and pain.   ACTIVITY LIMITATIONS: carrying, lifting, reach over head, and hygiene/grooming  PARTICIPATION LIMITATIONS: meal prep, cleaning, laundry, shopping, and community activity  PERSONAL FACTORS: Age and Past/current experiences are also affecting patient's functional outcome.   REHAB POTENTIAL: Good  CLINICAL DECISION MAKING: Stable/uncomplicated  EVALUATION COMPLEXITY: Low   GOALS: Goals reviewed with patient? Yes  SHORT TERM GOALS: Target date: 05/10/23  I with initial HEP Baseline: Goal status: 05/01/23-Attempting to identify appropriate exercises.  LONG TERM GOALS: Target date: 07/02/23  I with final HEP Baseline:  Goal status: INITIAL  2.  Improve L shoulder PROM to at least equal to her R Baseline: flex 110, abd 118, IR 33 Goal status: INITIAL  3.  Increase painfree L shoulder strength to at least 4/5, including scapula, in order to improve postural support and control. Baseline:  Goal status: INITIAL  4.  Patient will report return to her normal daily activities, including cleaning, with L shoulder pain < 3/10 Baseline: 7/10 Goal status: INITIAL PLAN:  PT FREQUENCY: 1-2x/week  PT DURATION: 10 weeks  PLANNED INTERVENTIONS: Therapeutic exercises,  Therapeutic activity, Neuromuscular re-education, Balance training, Gait training, Patient/Family education, Self Care, Joint mobilization, Dry Needling, Electrical stimulation, Spinal mobilization, Cryotherapy, Moist heat, Taping, Vasopneumatic device, Ionotophoresis 4mg /ml Dexamethasone, and Manual therapy  PLAN FOR NEXT SESSION: Initiate HEP   Iona Beard, DPT 05/05/2023, 1:50 PM

## 2023-05-07 ENCOUNTER — Ambulatory Visit: Payer: PPO | Admitting: Podiatry

## 2023-05-07 ENCOUNTER — Encounter: Payer: Self-pay | Admitting: Podiatry

## 2023-05-07 DIAGNOSIS — B351 Tinea unguium: Secondary | ICD-10-CM

## 2023-05-07 DIAGNOSIS — M79674 Pain in right toe(s): Secondary | ICD-10-CM

## 2023-05-07 DIAGNOSIS — M79675 Pain in left toe(s): Secondary | ICD-10-CM

## 2023-05-07 NOTE — Progress Notes (Signed)
This patient presents to the office with chief complaint of long thick painful nails.  Patient says the nails are painful walking and wearing shoes.  This patient is unable to self treat.  This patient is unable to trim her nails since she is unable to reach her nails. She presents to the office with her son. She presents to the office for preventative foot care services.  General Appearance  Alert, conversant and in no acute stress.  Vascular  Dorsalis pedis and posterior tibial  pulses are palpable  bilaterally.  Capillary return is within normal limits  bilaterally. Temperature is within normal limits  bilaterally.  Neurologic  Senn-Weinstein monofilament wire test within normal limits/diminished  bilaterally. Muscle power within normal limits bilaterally.  Nails Thick disfigured discolored nails with subungual debris  from hallux to fifth toes bilaterally. No evidence of bacterial infection or drainage bilaterally.  Orthopedic  No limitations of motion  feet .  No crepitus or effusions noted.  No bony pathology or digital deformities noted. HAV  B/L.  Skin  normotropic skin with no porokeratosis noted bilaterally.  No signs of infections or ulcers noted.     Onychomycosis  Nails  B/L.  Pain in right toes  Pain in left toes  Debridement of nails both feet followed trimming the nails with dremel tool.    RTC 3 months.   Helane Gunther DPM

## 2023-05-08 ENCOUNTER — Ambulatory Visit: Payer: PPO | Admitting: Physical Therapy

## 2023-05-08 ENCOUNTER — Encounter: Payer: Self-pay | Admitting: Physical Therapy

## 2023-05-08 DIAGNOSIS — R293 Abnormal posture: Secondary | ICD-10-CM

## 2023-05-08 DIAGNOSIS — G8929 Other chronic pain: Secondary | ICD-10-CM

## 2023-05-08 DIAGNOSIS — R278 Other lack of coordination: Secondary | ICD-10-CM

## 2023-05-08 DIAGNOSIS — M6281 Muscle weakness (generalized): Secondary | ICD-10-CM

## 2023-05-08 NOTE — Therapy (Signed)
OUTPATIENT PHYSICAL THERAPY SHOULDER TREATMENT   Patient Name: Mary Hayes MRN: [id number] DOB:[birth year], 82 y.o., female Today's Date: 05/08/2023  END OF SESSION:  PT End of Session - 05/08/23 1437     Visit Number 5    Date for PT Re-Evaluation 07/02/23    PT Start Time 1440    PT Stop Time 1520    PT Time Calculation (min) 40 min    Activity Tolerance Patient tolerated treatment well    Behavior During Therapy WFL for tasks assessed/performed             Past Medical History:  Diagnosis Date   Arthritis    Hypercholesteremia    Hypertension    Hypothyroidism    Sleep apnea    cpap   Past Surgical History:  Procedure Laterality Date   HAND SURGERY     right hand pinched nerve   JOINT REPLACEMENT Right    Knee   TONSILLECTOMY     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   Procedure: TOTAL KNEE ARTHROPLASTY;  Surgeon: Ollen Gross, MD;  Location: [facility];  Service: Orthopedics;  Laterality: Left;    Patient Active Problem List   Diagnosis Date Noted   Pain due to onychomycosis of toenails of both feet 05/07/2023   OA (osteoarthritis) of knee 01/11/2019    PCP: [facility]  REFERRING PROVIDER: Jackelyn Poling, DO  REFERRING DIAG: [phone] (ICD-10-CM) - Pain in left shoulder   THERAPY DIAG:  Abnormal posture  Muscle weakness (generalized)  Chronic pain in left shoulder  Other lack of coordination  Rationale for Evaluation and Treatment: Rehabilitation  ONSET DATE: 04/09/23  SUBJECTIVE:                                                                                                                                                                                      SUBJECTIVE STATEMENT: Doing ok, moving better. Pt repeated that she thinks her pain changes with the weather  Hand dominance: Left  PERTINENT HISTORY: Per referring Practitioner note: DM, Hypothyroidism Chronic L shoulder pain, exacerbated, decreased abd, TTP biceps  tendon and post shoulder, (+) Yergason's and empty can-suspect RC injury.  PAIN:  Are you having pain? Yes: NPRS scale: 4/10 Pain location: L shoulder Pain description: sharp Aggravating factors: elevation, reaching behind back Relieving factors: Pain meds help a bit, but make her sleepy.  PRECAUTIONS: None  WEIGHT BEARING RESTRICTIONS: No  FALLS:  Has patient fallen in last 6 months? No  LIVING ENVIRONMENT: Lives with: lives alone Lives in: House/apartment Stairs: Yes: External: 12 steps; can reach both Has following equipment at home: None  OCCUPATION: N/A-Interferes in housework.  PLOF: Independent  PATIENT GOALS:Decreased pain. Be able to do her housework without pain.  NEXT MD VISIT:   OBJECTIVE:   DIAGNOSTIC FINDINGS:  N/A  COGNITION: Overall cognitive status: Within functional limits for tasks assessed     SENSATION: Patient reports T & N in hands, chronic  POSTURE: Flattened thoracic and lumbar curves, round shoulders  UPPER EXTREMITY ROM: R shoulder mildly limited. B hands limited due to arthritis.  Passive ROM Right eval Left eval  Shoulder flexion  110  Shoulder extension    Shoulder abduction  118  Shoulder adduction    Shoulder internal rotation  55  Shoulder external rotation  33  Elbow flexion    Elbow extension    Wrist flexion    Wrist extension    Wrist ulnar deviation    Wrist radial deviation    Wrist pronation    Wrist supination    (Blank rows = not tested)  UPPER EXTREMITY MMT: L shoulder movement testing all limited due to pain with resistance. Appears to be approximately 3+/5. R shoulder 3+/5, elbows 4/5.  SHOULDER SPECIAL TESTS: Impingement tests: Neer impingement test: positive  and Painful arc test: positive  Rotator cuff assessment: Empty can test: positive  and Belly press test: negative Biceps assessment: Yergason's test: positive  and Speed's test: positive   JOINT MOBILITY TESTING:  L humeral inf glide and  post glide WNL  PALPATION:  TTP in L UT, rhomboids, posterior shoulder joint capsule, long head of biceps  CERVICAL ROM:   Flexion, R and L lateral flexion all limited due to pain, ext, B rotation WFL   TODAY'S TREATMENT:                                                                                                                                         DATE:  05/08/23 NuStep L4 x 6 minutes Standing shoulder ext, rows, ER with R tband resistance Seated shoulder press ups off table. Standing wall slides into flexion and scaption, 10 reps each, BUE, held LUE to lower excursion STM to L UT rhomboids, pects, using massage gun F/B stretch Doorway stretch GH self inf glide in sitting with arm on table. Attempted multiple stretches for UT, but she was not able to tolerate any of them. 3 Way shoulder elevation with 1# weights x 10.  05/05/23 NuStep L 4 x 6 min AAROM 1lb WaTE  Flex, Ext, IR up back x10 LUE IR red  2x10  LUE ER 2x10 AROM Shoulder abduction 2x10 Rows and Ext green 2x10 L shoulder PROM in al directions, STM to upper L trap  05/01/23 UBE 2:30 forward, but then patient reported pain, so stopped. STM-Patient reports pain with shoulder elevation. She reports chronic neck pain as well.  Palpation- TTP and very tight throughout entire upper trunk, including cervical paraspinals, scalenes, rhomboids, around entire circumference of L scapular, Pects, and biceps tendon.  Therapist attempted STM to the area, she tolerated some of the treatment, at other times winced. Moved to scapular mobilizations and Pect stretch with same results, some relief, but minimal.  Passive shoulder flexion in sit with facilitation to L scap. Able to achieve > 90 with no report of pain, but on second attempt, patient and therapist both felt multiple pops, unable to identify exact location. She reports popping in both her neck and L shoulder when trying to move. Initiated active shoulder elevation with gentle  wall slides into flexion. She was able to perform to just above 90 flexion, but then felt the popping. Attempted pendulum exercises, but she could not relax sufficiently to achieve true pendulum. Offered CP for inflammation after the STM, but patient declined.  04/27/25 AAROM Cane Flex, Ext, IR up back x10 Rows & Ext yellow 2x10 LUE ER AROM 2x10 LUE IR yellow 2x10  Biceps curle 3lb WaTE 2x10 2lb shrugs x10 NuStpe L 4 x 5 min  LUE PROM in all directions  04/23/23 Education  PATIENT EDUCATION: Education details: POC Person educated: Patient, grandson Education method: Explanation Education comprehension: verbalized understanding  HOME EXERCISE PROGRAM: [id number]  ASSESSMENT:  CLINICAL IMPRESSION: Patient reports some pain. Established HEP with stretching and strengthening activities, which she was able to get through without too much pain, had to modify a couple of the strength exercises. She did not tolerate any UT stretching due to pain. Introduced massage gun, which provided decreased tension in traps.  OBJECTIVE IMPAIRMENTS: decreased activity tolerance, decreased mobility, decreased ROM, decreased strength, impaired flexibility, impaired UE functional use, improper body mechanics, postural dysfunction, and pain.   ACTIVITY LIMITATIONS: carrying, lifting, reach over head, and hygiene/grooming  PARTICIPATION LIMITATIONS: meal prep, cleaning, laundry, shopping, and community activity  PERSONAL FACTORS: Age and Past/current experiences are also affecting patient's functional outcome.   REHAB POTENTIAL: Good  CLINICAL DECISION MAKING: Stable/uncomplicated  EVALUATION COMPLEXITY: Low   GOALS: Goals reviewed with patient? Yes  SHORT TERM GOALS: Target date: 05/10/23  I with initial HEP Baseline: Goal status: 05/08/23-Provided HEP  LONG TERM GOALS: Target date: 07/02/23  I with final HEP Baseline:  Goal status: INITIAL  2.  Improve L shoulder PROM to at least equal to  her R Baseline: flex 110, abd 118, IR 33 Goal status: INITIAL  3.  Increase painfree L shoulder strength to at least 4/5, including scapula, in order to improve postural support and control. Baseline:  Goal status: INITIAL  4.  Patient will report return to her normal daily activities, including cleaning, with L shoulder pain < 3/10 Baseline: 7/10 Goal status: INITIAL PLAN:  PT FREQUENCY: 1-2x/week  PT DURATION: 10 weeks  PLANNED INTERVENTIONS: Therapeutic exercises, Therapeutic activity, Neuromuscular re-education, Balance training, Gait training, Patient/Family education, Self Care, Joint mobilization, Dry Needling, Electrical stimulation, Spinal mobilization, Cryotherapy, Moist heat, Taping, Vasopneumatic device, Ionotophoresis 4mg /ml Dexamethasone, and Manual therapy  PLAN FOR NEXT SESSION: Initiate HEP   Iona Beard, DPT 05/08/2023, 3:34 PM

## 2023-05-12 ENCOUNTER — Encounter: Payer: Self-pay | Admitting: Physical Therapy

## 2023-05-12 ENCOUNTER — Ambulatory Visit: Payer: PPO | Admitting: Physical Therapy

## 2023-05-12 DIAGNOSIS — M6281 Muscle weakness (generalized): Secondary | ICD-10-CM

## 2023-05-12 DIAGNOSIS — R293 Abnormal posture: Secondary | ICD-10-CM

## 2023-05-12 DIAGNOSIS — G8929 Other chronic pain: Secondary | ICD-10-CM

## 2023-05-12 NOTE — Therapy (Signed)
OUTPATIENT PHYSICAL THERAPY SHOULDER TREATMENT   Patient Name: Mary Hayes MRN: [id number] DOB:04-27-1941, 82 y.o., female Today's Date: 05/12/2023  END OF SESSION:  PT End of Session - 05/12/23 1340     Visit Number 6    Date for PT Re-Evaluation 07/02/23    PT Start Time 1340    PT Stop Time 1425    PT Time Calculation (min) 45 min    Activity Tolerance Patient tolerated treatment well    Behavior During Therapy WFL for tasks assessed/performed             Past Medical History:  Diagnosis Date   Arthritis    Hypercholesteremia    Hypertension    Hypothyroidism    Sleep apnea    cpap   Past Surgical History:  Procedure Laterality Date   HAND SURGERY     right hand pinched nerve   JOINT REPLACEMENT Right    Knee   TONSILLECTOMY     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   Procedure: TOTAL KNEE ARTHROPLASTY;  Surgeon: Ollen Gross, MD;  Location: [facility];  Service: Orthopedics;  Laterality: Left;    Patient Active Problem List   Diagnosis Date Noted   Pain due to onychomycosis of toenails of both feet 05/07/2023   OA (osteoarthritis) of knee 01/11/2019    [facility]P: [facility]  REFERRING PROVIDER: Jackelyn Poling, DO  REFERRING DIAG: [phone] (ICD-10-CM) - Pain in left shoulder   THERAPY DIAG:  Abnormal posture  Chronic pain in left shoulder  Muscle weakness (generalized)  Rationale for Evaluation and Treatment: Rehabilitation  ONSET DATE: 04/09/23  SUBJECTIVE:                                                                                                                                                                                      SUBJECTIVE STATEMENT: Much better. Mary Hayes got her a machine (thera gun)  Hand dominance: Left  PERTINENT HISTORY: Per referring Practitioner note: DM, Hypothyroidism Chronic L shoulder pain, exacerbated, decreased abd, TTP biceps tendon and post shoulder, (+) Yergason's and empty can-suspect RC  injury.  PAIN:  Are you having pain? Yes: NPRS scale: 2/10 Pain location: L shoulder Pain description: sharp Aggravating factors: elevation, reaching behind back Relieving factors: Pain meds help a bit, but make her sleepy.  PRECAUTIONS: None  WEIGHT BEARING RESTRICTIONS: No  FALLS:  Has patient fallen in last 6 months? No  LIVING ENVIRONMENT: Lives with: lives alone Lives in: House/apartment Stairs: Yes: External: 12 steps; can reach both Has following equipment at home: None  OCCUPATION: N/A-Interferes in housework.   PLOF: Independent  PATIENT GOALS:Decreased pain. Be able to  do her housework without pain.  NEXT MD VISIT:   OBJECTIVE:   DIAGNOSTIC FINDINGS:  N/A  COGNITION: Overall cognitive status: Within functional limits for tasks assessed     SENSATION: Patient reports T & N in hands, chronic  POSTURE: Flattened thoracic and lumbar curves, round shoulders  UPPER EXTREMITY ROM: R shoulder mildly limited. B hands limited due to arthritis.  Passive ROM Right eval Left eval Left AROM 05/12/23  Shoulder flexion  110 137  Shoulder extension     Shoulder abduction  118 143  Shoulder adduction     Shoulder internal rotation  55 39  Shoulder external rotation  33 53  Elbow flexion     Elbow extension     Wrist flexion     Wrist extension     Wrist ulnar deviation     Wrist radial deviation     Wrist pronation     Wrist supination     (Blank rows = not tested)  UPPER EXTREMITY MMT: L shoulder movement testing all limited due to pain with resistance. Appears to be approximately 3+/5. R shoulder 3+/5, elbows 4/5.  SHOULDER SPECIAL TESTS: Impingement tests: Neer impingement test: positive  and Painful arc test: positive  Rotator cuff assessment: Empty can test: positive  and Belly press test: negative Biceps assessment: Yergason's test: positive  and Speed's test: positive   JOINT MOBILITY TESTING:  L humeral inf glide and post glide  WNL  PALPATION:  TTP in L UT, rhomboids, posterior shoulder joint capsule, long head of biceps  CERVICAL ROM:   Flexion, R and L lateral flexion all limited due to pain, ext, B rotation WFL   TODAY'S TREATMENT:                                                                                                                                         DATE:  05/12/23 NuStep L4 x 7 minutes CHECKED ROM AAROM 2lb WaTE  Flex, Ext, IR up back x10 Biceps Curls 2lb WaTE 2x10 Row & Ext green 2x10 IR LUE red 2x10 ER yellow 2x10 very weak  Shoulder abd 2lb 2x10 Shoulder flex 2lb 2x10  Triceps Ext 15lb 2x10 PROM to LUE in all directions with end range holds  05/08/23 NuStep L4 x 6 minutes Standing shoulder ext, rows, ER with R tband resistance Seated shoulder press ups off table. Standing wall slides into flexion and scaption, 10 reps each, BUE, held LUE to lower excursion STM to L UT rhomboids, pects, using massage gun F/B stretch Doorway stretch GH self inf glide in sitting with arm on table. Attempted multiple stretches for UT, but she was not able to tolerate any of them. 3 Way shoulder elevation with 1# weights x 10.  05/05/23 NuStep L 4 x 6 min AAROM 1lb WaTE  Flex, Ext, IR up back x10 LUE IR red  2x10  LUE ER 2x10  AROM Shoulder abduction 2x10 Rows and Ext green 2x10 L shoulder PROM in al directions, STM to upper L trap  05/01/23 UBE 2:30 forward, but then patient reported pain, so stopped. STM-Patient reports pain with shoulder elevation. She reports chronic neck pain as well.  Palpation- TTP and very tight throughout entire upper trunk, including cervical paraspinals, scalenes, rhomboids, around entire circumference of L scapular, Pects, and biceps tendon.  Therapist attempted STM to the area, she tolerated some of the treatment, at other times winced. Moved to scapular mobilizations and Pect stretch with same results, some relief, but minimal.  Passive shoulder flexion in sit with  facilitation to L scap. Able to achieve > 90 with no report of pain, but on second attempt, patient and therapist both felt multiple pops, unable to identify exact location. She reports popping in both her neck and L shoulder when trying to move. Initiated active shoulder elevation with gentle wall slides into flexion. She was able to perform to just above 90 flexion, but then felt the popping. Attempted pendulum exercises, but she could not relax sufficiently to achieve true pendulum. Offered CP for inflammation after the STM, but patient declined.  04/27/25 AAROM Cane Flex, Ext, IR up back x10 Rows & Ext yellow 2x10 LUE ER AROM 2x10 LUE IR yellow 2x10  Biceps curle 3lb WaTE 2x10 2lb shrugs x10 NuStpe L 4 x 5 min  LUE PROM in all directions  04/23/23 Education  PATIENT EDUCATION: Education details: POC Person educated: Patient, grandson Education method: Explanation Education comprehension: verbalized understanding  HOME EXERCISE PROGRAM: [id number]  ASSESSMENT:  CLINICAL IMPRESSION: Pt enters with reports of improvement. Establish base line AROM of LUE. Sje was very guarded with PROM more so with ER/ IR. Pain Tactile cues needed for posture with rows and extensions.   OBJECTIVE IMPAIRMENTS: decreased activity tolerance, decreased mobility, decreased ROM, decreased strength, impaired flexibility, impaired UE functional use, improper body mechanics, postural dysfunction, and pain.   ACTIVITY LIMITATIONS: carrying, lifting, reach over head, and hygiene/grooming  PARTICIPATION LIMITATIONS: meal prep, cleaning, laundry, shopping, and community activity  PERSONAL FACTORS: Age and Past/current experiences are also affecting patient's functional outcome.   REHAB POTENTIAL: Good  CLINICAL DECISION MAKING: Stable/uncomplicated  EVALUATION COMPLEXITY: Low   GOALS: Goals reviewed with patient? Yes  SHORT TERM GOALS: Target date: 05/10/23  I with initial HEP Baseline: Goal status:  05/08/23-Provided HEP  LONG TERM GOALS: Target date: 07/02/23  I with final HEP Baseline:  Goal status: INITIAL  2.  Improve L shoulder PROM to at least equal to her R Baseline: flex 110, abd 118, IR 33 Goal status: Progressing 05/12/23  3.  Increase painfree L shoulder strength to at least 4/5, including scapula, in order to improve postural support and control. Baseline:  Goal status: INITIAL  4.  Patient will report return to her normal daily activities, including cleaning, with L shoulder pain < 3/10 Baseline: 7/10 Goal status: INITIAL PLAN:  PT FREQUENCY: 1-2x/week  PT DURATION: 10 weeks  PLANNED INTERVENTIONS: Therapeutic exercises, Therapeutic activity, Neuromuscular re-education, Balance training, Gait training, Patient/Family education, Self Care, Joint mobilization, Dry Needling, Electrical stimulation, Spinal mobilization, Cryotherapy, Moist heat, Taping, Vasopneumatic device, Ionotophoresis 4mg /ml Dexamethasone, and Manual therapy  PLAN FOR NEXT SESSION: Initiate HEP   Iona Beard, DPT 05/12/2023, 1:40 PM

## 2023-05-15 ENCOUNTER — Encounter: Payer: Self-pay | Admitting: Physical Therapy

## 2023-05-15 ENCOUNTER — Ambulatory Visit: Payer: PPO | Admitting: Physical Therapy

## 2023-05-15 DIAGNOSIS — G8929 Other chronic pain: Secondary | ICD-10-CM

## 2023-05-15 DIAGNOSIS — R293 Abnormal posture: Secondary | ICD-10-CM

## 2023-05-15 DIAGNOSIS — M6281 Muscle weakness (generalized): Secondary | ICD-10-CM

## 2023-05-15 NOTE — Therapy (Signed)
OUTPATIENT PHYSICAL THERAPY SHOULDER TREATMENT   Patient Name: Mary Hayes MRN: [id number] DOB:09-27-1941, 82 y.o., female Today's Date: 05/15/2023  END OF SESSION:  PT End of Session - 05/15/23 1345     Visit Number 7    Date for PT Re-Evaluation 07/02/23    PT Start Time 1345    PT Stop Time 1430    PT Time Calculation (min) 45 min    Activity Tolerance Patient tolerated treatment well    Behavior During Therapy WFL for tasks assessed/performed             Past Medical History:  Diagnosis Date   Arthritis    Hypercholesteremia    Hypertension    Hypothyroidism    Sleep apnea    cpap   Past Surgical History:  Procedure Laterality Date   HAND SURGERY     right hand pinched nerve   JOINT REPLACEMENT Right    Knee   TONSILLECTOMY     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   Procedure: TOTAL KNEE ARTHROPLASTY;  Surgeon: Ollen Gross, MD;  Location: [facility];  Service: Orthopedics;  Laterality: Left;    Patient Active Problem List   Diagnosis Date Noted   Pain due to onychomycosis of toenails of both feet 05/07/2023   OA (osteoarthritis) of knee 01/11/2019    PCP: [facility]  REFERRING PROVIDER: Jackelyn Poling, DO  REFERRING DIAG: [phone] (ICD-10-CM) - Pain in left shoulder   THERAPY DIAG:  Abnormal posture  Chronic pain in left shoulder  Muscle weakness (generalized)  Rationale for Evaluation and Treatment: Rehabilitation  ONSET DATE: 04/09/23  SUBJECTIVE:                                                                                                                                                                                      SUBJECTIVE STATEMENT: A little bit better  Hand dominance: Left  PERTINENT HISTORY: Per referring Practitioner note: DM, Hypothyroidism Chronic L shoulder pain, exacerbated, decreased abd, TTP biceps tendon and post shoulder, (+) Yergason's and empty can-suspect RC injury.  PAIN:  Are you having  pain? Yes: NPRS scale: 0/10 Pain location: L shoulder Pain description: sharp Aggravating factors: elevation, reaching behind back Relieving factors: Pain meds help a bit, but make her sleepy.  PRECAUTIONS: None  WEIGHT BEARING RESTRICTIONS: No  FALLS:  Has patient fallen in last 6 months? No  LIVING ENVIRONMENT: Lives with: lives alone Lives in: House/apartment Stairs: Yes: External: 12 steps; can reach both Has following equipment at home: None  OCCUPATION: N/A-Interferes in housework.   PLOF: Independent  PATIENT GOALS:Decreased pain. Be able to do her housework without pain.  NEXT MD VISIT:   OBJECTIVE:   DIAGNOSTIC FINDINGS:  N/A  COGNITION: Overall cognitive status: Within functional limits for tasks assessed     SENSATION: Patient reports T & N in hands, chronic  POSTURE: Flattened thoracic and lumbar curves, round shoulders  UPPER EXTREMITY ROM: R shoulder mildly limited. B hands limited due to arthritis.  Passive ROM Right eval Left eval Left AROM 05/12/23  Shoulder flexion  110 137  Shoulder extension     Shoulder abduction  118 143  Shoulder adduction     Shoulder internal rotation  55 39  Shoulder external rotation  33 53  Elbow flexion     Elbow extension     Wrist flexion     Wrist extension     Wrist ulnar deviation     Wrist radial deviation     Wrist pronation     Wrist supination     (Blank rows = not tested)  UPPER EXTREMITY MMT: L shoulder movement testing all limited due to pain with resistance. Appears to be approximately 3+/5. R shoulder 3+/5, elbows 4/5.  SHOULDER SPECIAL TESTS: Impingement tests: Neer impingement test: positive  and Painful arc test: positive  Rotator cuff assessment: Empty can test: positive  and Belly press test: negative Biceps assessment: Yergason's test: positive  and Speed's test: positive   JOINT MOBILITY TESTING:  L humeral inf glide and post glide WNL  PALPATION:  TTP in L UT, rhomboids,  posterior shoulder joint capsule, long head of biceps  CERVICAL ROM:   Flexion, R and L lateral flexion all limited due to pain, ext, B rotation WFL   TODAY'S TREATMENT:                                                                                                                                         DATE:  05/15/23 NuStep L4 x 7 minutes Seated Rows and & Lats 20lb 2x10  Shoulder Ext green 2x12 AAROM 3lb WaTE  Flex, Ext, IR up back x10 IR LUE green 2x10 ER yellow 2x10 very weak  Shoulder abd 1lb x10 Shoulder flex 1 lb x10  PROM to LUE in all directions with end range holds  05/12/23 NuStep L4 x 7 minutes CHECKED ROM AAROM 2lb WaTE  Flex, Ext, IR up back x10 Biceps Curls 2lb WaTE 2x10 Row & Ext green 2x10 IR LUE red 2x10 ER yellow 2x10 very weak  Shoulder abd 2lb 2x10 Shoulder flex 2lb 2x10  Triceps Ext 15lb 2x10 PROM to LUE in all directions with end range holds  05/08/23 NuStep L4 x 6 minutes Standing shoulder ext, rows, ER with R tband resistance Seated shoulder press ups off table. Standing wall slides into flexion and scaption, 10 reps each, BUE, held LUE to lower excursion STM to L UT rhomboids, pects, using massage gun F/B stretch Doorway stretch GH self inf glide in sitting with arm  on table. Attempted multiple stretches for UT, but she was not able to tolerate any of them. 3 Way shoulder elevation with 1# weights x 10.  05/05/23 NuStep L 4 x 6 min AAROM 1lb WaTE  Flex, Ext, IR up back x10 LUE IR red  2x10  LUE ER 2x10 AROM Shoulder abduction 2x10 Rows and Ext green 2x10 L shoulder PROM in al directions, STM to upper L trap  05/01/23 UBE 2:30 forward, but then patient reported pain, so stopped. STM-Patient reports pain with shoulder elevation. She reports chronic neck pain as well.  Palpation- TTP and very tight throughout entire upper trunk, including cervical paraspinals, scalenes, rhomboids, around entire circumference of L scapular, Pects, and biceps  tendon.  Therapist attempted STM to the area, she tolerated some of the treatment, at other times winced. Moved to scapular mobilizations and Pect stretch with same results, some relief, but minimal.  Passive shoulder flexion in sit with facilitation to L scap. Able to achieve > 90 with no report of pain, but on second attempt, patient and therapist both felt multiple pops, unable to identify exact location. She reports popping in both her neck and L shoulder when trying to move. Initiated active shoulder elevation with gentle wall slides into flexion. She was able to perform to just above 90 flexion, but then felt the popping. Attempted pendulum exercises, but she could not relax sufficiently to achieve true pendulum. Offered CP for inflammation after the STM, but patient declined.  04/27/25 AAROM Cane Flex, Ext, IR up back x10 Rows & Ext yellow 2x10 LUE ER AROM 2x10 LUE IR yellow 2x10  Biceps curle 3lb WaTE 2x10 2lb shrugs x10 NuStpe L 4 x 5 min  LUE PROM in all directions  04/23/23 Education  PATIENT EDUCATION: Education details: POC Person educated: Patient, grandson Education method: Explanation Education comprehension: verbalized understanding  HOME EXERCISE PROGRAM: [id number]  ASSESSMENT:  CLINICAL IMPRESSION: Pt enters with reports of improvement. Pt remains was very guarded with PROM more so with ER/ IR. Pain Tactile cues needed for posture with rows. No issues with a progression toe machine level interventions. Cues needed to rest more between sets. More difficulty with shoulder flexion and extension compared to last session. This could be to being pre fatigue form the other interventions.  OBJECTIVE IMPAIRMENTS: decreased activity tolerance, decreased mobility, decreased ROM, decreased strength, impaired flexibility, impaired UE functional use, improper body mechanics, postural dysfunction, and pain.   ACTIVITY LIMITATIONS: carrying, lifting, reach over head, and  hygiene/grooming  PARTICIPATION LIMITATIONS: meal prep, cleaning, laundry, shopping, and community activity  PERSONAL FACTORS: Age and Past/current experiences are also affecting patient's functional outcome.   REHAB POTENTIAL: Good  CLINICAL DECISION MAKING: Stable/uncomplicated  EVALUATION COMPLEXITY: Low   GOALS: Goals reviewed with patient? Yes  SHORT TERM GOALS: Target date: 05/10/23  I with initial HEP Baseline: Goal status: 05/08/23-Provided HEP  LONG TERM GOALS: Target date: 07/02/23  I with final HEP Baseline:  Goal status: INITIAL  2.  Improve L shoulder PROM to at least equal to her R Baseline: flex 110, abd 118, IR 33 Goal status: Progressing 05/12/23  3.  Increase painfree L shoulder strength to at least 4/5, including scapula, in order to improve postural support and control. Baseline:  Goal status: INITIAL  4.  Patient will report return to her normal daily activities, including cleaning, with L shoulder pain < 3/10 Baseline: 7/10 Goal status: INITIAL PLAN:  PT FREQUENCY: 1-2x/week  PT DURATION: 10 weeks  PLANNED INTERVENTIONS: Therapeutic  exercises, Therapeutic activity, Neuromuscular re-education, Balance training, Gait training, Patient/Family education, Self Care, Joint mobilization, Dry Needling, Electrical stimulation, Spinal mobilization, Cryotherapy, Moist heat, Taping, Vasopneumatic device, Ionotophoresis 4mg /ml Dexamethasone, and Manual therapy  PLAN FOR NEXT SESSION: Initiate HEP   Iona Beard, DPT 05/15/2023, 1:48 PM

## 2023-05-20 ENCOUNTER — Ambulatory Visit: Payer: PPO | Admitting: Physical Therapy

## 2023-05-21 ENCOUNTER — Encounter: Payer: Self-pay | Admitting: Physical Therapy

## 2023-05-21 ENCOUNTER — Ambulatory Visit: Payer: PPO | Admitting: Physical Therapy

## 2023-05-21 DIAGNOSIS — R293 Abnormal posture: Secondary | ICD-10-CM | POA: Diagnosis not present

## 2023-05-21 DIAGNOSIS — G8929 Other chronic pain: Secondary | ICD-10-CM

## 2023-05-21 DIAGNOSIS — M6281 Muscle weakness (generalized): Secondary | ICD-10-CM

## 2023-05-21 NOTE — Therapy (Signed)
OUTPATIENT PHYSICAL THERAPY SHOULDER TREATMENT   Patient Name: Mary Hayes MRN: [id number] DOB:Dec 22, 1941, 82 y.o., female Today's Date: 05/21/2023  END OF SESSION:  PT End of Session - 05/21/23 1350     Visit Number 8    Date for PT Re-Evaluation 07/02/23    PT Start Time 1345    PT Stop Time 1430    PT Time Calculation (min) 45 min    Activity Tolerance Patient tolerated treatment well    Behavior During Therapy WFL for tasks assessed/performed             Past Medical History:  Diagnosis Date   Arthritis    Hypercholesteremia    Hypertension    Hypothyroidism    Sleep apnea    cpap   Past Surgical History:  Procedure Laterality Date   HAND SURGERY     right hand pinched nerve   JOINT REPLACEMENT Right    Knee   TONSILLECTOMY     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   Procedure: TOTAL KNEE ARTHROPLASTY;  Surgeon: Ollen Gross, MD;  Location: [facility];  Service: Orthopedics;  Laterality: Left;    Patient Active Problem List   Diagnosis Date Noted   Pain due to onychomycosis of toenails of both feet 05/07/2023   OA (osteoarthritis) of knee 01/11/2019    [facility]P: [facility]  REFERRING PROVIDER: Jackelyn Poling, DO  REFERRING DIAG: [phone] (ICD-10-CM) - Pain in left shoulder   THERAPY DIAG:  Muscle weakness (generalized)  Chronic pain in left shoulder  Abnormal posture  Rationale for Evaluation and Treatment: Rehabilitation  ONSET DATE: 04/09/23  SUBJECTIVE:                                                                                                                                                                                      SUBJECTIVE STATEMENT: Yesterday had some pain cause of the weather today much better  Hand dominance: Left  PERTINENT HISTORY: Per referring Practitioner note: DM, Hypothyroidism Chronic L shoulder pain, exacerbated, decreased abd, TTP biceps tendon and post shoulder, (+) Yergason's and empty  can-suspect RC injury.  PAIN:  Are you having pain? Yes: NPRS scale: 4/10 Pain location: L shoulder Pain description: sharp Aggravating factors: elevation, reaching behind back Relieving factors: Pain meds help a bit, but make her sleepy.  PRECAUTIONS: None  WEIGHT BEARING RESTRICTIONS: No  FALLS:  Has patient fallen in last 6 months? No  LIVING ENVIRONMENT: Lives with: lives alone Lives in: House/apartment Stairs: Yes: External: 12 steps; can reach both Has following equipment at home: None  OCCUPATION: N/A-Interferes in housework.   PLOF: Independent  PATIENT GOALS:Decreased pain. Be  able to do her housework without pain.  NEXT MD VISIT:   OBJECTIVE:   DIAGNOSTIC FINDINGS:  N/A  COGNITION: Overall cognitive status: Within functional limits for tasks assessed     SENSATION: Patient reports T & N in hands, chronic  POSTURE: Flattened thoracic and lumbar curves, round shoulders  UPPER EXTREMITY ROM: R shoulder mildly limited. B hands limited due to arthritis.  Passive ROM Right eval Left eval Left AROM 05/12/23  Shoulder flexion  110 137  Shoulder extension     Shoulder abduction  118 143  Shoulder adduction     Shoulder internal rotation  55 39  Shoulder external rotation  33 53  Elbow flexion     Elbow extension     Wrist flexion     Wrist extension     Wrist ulnar deviation     Wrist radial deviation     Wrist pronation     Wrist supination     (Blank rows = not tested)  UPPER EXTREMITY MMT: L shoulder movement testing all limited due to pain with resistance. Appears to be approximately 3+/5. R shoulder 3+/5, elbows 4/5.  SHOULDER SPECIAL TESTS: Impingement tests: Neer impingement test: positive  and Painful arc test: positive  Rotator cuff assessment: Empty can test: positive  and Belly press test: negative Biceps assessment: Yergason's test: positive  and Speed's test: positive   JOINT MOBILITY TESTING:  L humeral inf glide and post glide  WNL  PALPATION:  TTP in L UT, rhomboids, posterior shoulder joint capsule, long head of biceps  CERVICAL ROM:   Flexion, R and L lateral flexion all limited due to pain, ext, B rotation WFL   TODAY'S TREATMENT:                                                                                                                                         DATE:  05/21/23 NuStep L4 x 7 minutes Seated Rows and & Lats 20lb 2x12 Shoulder Ext 5lb 2x10 1lb shoulder abd 2x12 Shoulder Flex 2lb 2x12 Biceps Curls 3lb WaTE 2x10 ER yellow 2x10 very weak  PROM to LUE in all directions with end range holds  05/15/23 NuStep L4 x 7 minutes Seated Rows and & Lats 20lb 2x10  Shoulder Ext green 2x12 AAROM 3lb WaTE  Flex, Ext, IR up back x10 IR LUE green 2x10 ER yellow 2x10 very weak  Shoulder abd 1lb x10 Shoulder flex 1 lb x10  PROM to LUE in all directions with end range holds  05/12/23 NuStep L4 x 7 minutes CHECKED ROM AAROM 2lb WaTE  Flex, Ext, IR up back x10 Biceps Curls 2lb WaTE 2x10 Row & Ext green 2x10 IR LUE red 2x10 ER yellow 2x10 very weak  Shoulder abd 2lb 2x10 Shoulder flex 2lb 2x10  Triceps Ext 15lb 2x10 PROM to LUE in all directions with end range holds  05/08/23 NuStep L4  x 6 minutes Standing shoulder ext, rows, ER with R tband resistance Seated shoulder press ups off table. Standing wall slides into flexion and scaption, 10 reps each, BUE, held LUE to lower excursion STM to L UT rhomboids, pects, using massage gun F/B stretch Doorway stretch GH self inf glide in sitting with arm on table. Attempted multiple stretches for UT, but she was not able to tolerate any of them. 3 Way shoulder elevation with 1# weights x 10.  05/05/23 NuStep L 4 x 6 min AAROM 1lb WaTE  Flex, Ext, IR up back x10 LUE IR red  2x10  LUE ER 2x10 AROM Shoulder abduction 2x10 Rows and Ext green 2x10 L shoulder PROM in al directions, STM to upper L trap  05/01/23 UBE 2:30 forward, but then patient reported  pain, so stopped. STM-Patient reports pain with shoulder elevation. She reports chronic neck pain as well.  Palpation- TTP and very tight throughout entire upper trunk, including cervical paraspinals, scalenes, rhomboids, around entire circumference of L scapular, Pects, and biceps tendon.  Therapist attempted STM to the area, she tolerated some of the treatment, at other times winced. Moved to scapular mobilizations and Pect stretch with same results, some relief, but minimal.  Passive shoulder flexion in sit with facilitation to L scap. Able to achieve > 90 with no report of pain, but on second attempt, patient and therapist both felt multiple pops, unable to identify exact location. She reports popping in both her neck and L shoulder when trying to move. Initiated active shoulder elevation with gentle wall slides into flexion. She was able to perform to just above 90 flexion, but then felt the popping. Attempted pendulum exercises, but she could not relax sufficiently to achieve true pendulum. Offered CP for inflammation after the STM, but patient declined.  04/27/25 AAROM Cane Flex, Ext, IR up back x10 Rows & Ext yellow 2x10 LUE ER AROM 2x10 LUE IR yellow 2x10  Biceps curle 3lb WaTE 2x10 2lb shrugs x10 NuStpe L 4 x 5 min  LUE PROM in all directions  04/23/23 Education  PATIENT EDUCATION: Education details: POC Person educated: Patient, grandson Education method: Explanation Education comprehension: verbalized understanding  HOME EXERCISE PROGRAM: [id number]  ASSESSMENT:  CLINICAL IMPRESSION: Pt enters with reports of improvement. Pain Tactile cues needed for posture with rows and extensions. Good carryover with machine level interventions tolerating increase reps. Cues needed to rest more between sets. Pt did well with flexion and abduction but fatigues quick. Pt has good PROM  OBJECTIVE IMPAIRMENTS: decreased activity tolerance, decreased mobility, decreased ROM, decreased  strength, impaired flexibility, impaired UE functional use, improper body mechanics, postural dysfunction, and pain.   ACTIVITY LIMITATIONS: carrying, lifting, reach over head, and hygiene/grooming  PARTICIPATION LIMITATIONS: meal prep, cleaning, laundry, shopping, and community activity  PERSONAL FACTORS: Age and Past/current experiences are also affecting patient's functional outcome.   REHAB POTENTIAL: Good  CLINICAL DECISION MAKING: Stable/uncomplicated  EVALUATION COMPLEXITY: Low   GOALS: Goals reviewed with patient? Yes  SHORT TERM GOALS: Target date: 05/10/23  I with initial HEP Baseline: Goal status: 05/08/23-Provided HEP  LONG TERM GOALS: Target date: 07/02/23  I with final HEP Baseline:  Goal status: INITIAL  2.  Improve L shoulder PROM to at least equal to her R Baseline: flex 110, abd 118, IR 33 Goal status: Progressing 05/12/23  3.  Increase painfree L shoulder strength to at least 4/5, including scapula, in order to improve postural support and control. Baseline:  Goal status: INITIAL  4.  Patient will report return to her normal daily activities, including cleaning, with L shoulder pain < 3/10 Baseline: 7/10 Goal status: INITIAL PLAN:  PT FREQUENCY: 1-2x/week  PT DURATION: 10 weeks  PLANNED INTERVENTIONS: Therapeutic exercises, Therapeutic activity, Neuromuscular re-education, Balance training, Gait training, Patient/Family education, Self Care, Joint mobilization, Dry Needling, Electrical stimulation, Spinal mobilization, Cryotherapy, Moist heat, Taping, Vasopneumatic device, Ionotophoresis 4mg /ml Dexamethasone, and Manual therapy  PLAN FOR NEXT SESSION: Initiate HEP   Iona Beard, DPT 05/21/2023, 1:51 PM

## 2023-05-22 ENCOUNTER — Encounter: Payer: Self-pay | Admitting: Physical Therapy

## 2023-05-22 ENCOUNTER — Ambulatory Visit: Payer: PPO | Admitting: Physical Therapy

## 2023-05-22 DIAGNOSIS — R293 Abnormal posture: Secondary | ICD-10-CM | POA: Diagnosis not present

## 2023-05-22 DIAGNOSIS — M6281 Muscle weakness (generalized): Secondary | ICD-10-CM

## 2023-05-22 DIAGNOSIS — G8929 Other chronic pain: Secondary | ICD-10-CM

## 2023-05-22 NOTE — Therapy (Signed)
OUTPATIENT PHYSICAL THERAPY SHOULDER TREATMENT   Patient Name: Mary Hayes MRN: [id number] DOB:06/05/1941, 82 y.o., female Today's Date: 05/22/2023  END OF SESSION:  PT End of Session - 05/22/23 1346     Visit Number 9    Date for PT Re-Evaluation 07/02/23    PT Start Time 1346    PT Stop Time 1430    PT Time Calculation (min) 44 min    Activity Tolerance Patient tolerated treatment well    Behavior During Therapy WFL for tasks assessed/performed             Past Medical History:  Diagnosis Date   Arthritis    Hypercholesteremia    Hypertension    Hypothyroidism    Sleep apnea    cpap   Past Surgical History:  Procedure Laterality Date   HAND SURGERY     right hand pinched nerve   JOINT REPLACEMENT Right    Knee   TONSILLECTOMY     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   Procedure: TOTAL KNEE ARTHROPLASTY;  Surgeon: Ollen Gross, MD;  Location: [facility];  Service: Orthopedics;  Laterality: Left;    Patient Active Problem List   Diagnosis Date Noted   Pain due to onychomycosis of toenails of both feet 05/07/2023   OA (osteoarthritis) of knee 01/11/2019    PCP: [facility]  REFERRING PROVIDER: Jackelyn Poling, DO  REFERRING DIAG: [phone] (ICD-10-CM) - Pain in left shoulder   THERAPY DIAG:  Chronic pain in left shoulder  Abnormal posture  Muscle weakness (generalized)  Rationale for Evaluation and Treatment: Rehabilitation  ONSET DATE: 04/09/23 2 SUBJECTIVE:                                                                                                                                                                                      SUBJECTIVE STATEMENT: "Everything ok"  Hand dominance: Left  PERTINENT HISTORY: Per referring Practitioner note: DM, Hypothyroidism Chronic L shoulder pain, exacerbated, decreased abd, TTP biceps tendon and post shoulder, (+) Yergason's and empty can-suspect RC injury.  PAIN:  Are you having  pain? Yes: NPRS scale: 2/10 Pain location: L shoulder Pain description: sharp Aggravating factors: elevation, reaching behind back Relieving factors: Pain meds help a bit, but make her sleepy.  PRECAUTIONS: None  WEIGHT BEARING RESTRICTIONS: No  FALLS:  Has patient fallen in last 6 months? No  LIVING ENVIRONMENT: Lives with: lives alone Lives in: House/apartment Stairs: Yes: External: 12 steps; can reach both Has following equipment at home: None  OCCUPATION: N/A-Interferes in housework.   PLOF: Independent  PATIENT GOALS:Decreased pain. Be able to do her housework without pain.  NEXT  MD VISIT:   OBJECTIVE:   DIAGNOSTIC FINDINGS:  N/A  COGNITION: Overall cognitive status: Within functional limits for tasks assessed     SENSATION: Patient reports T & N in hands, chronic  POSTURE: Flattened thoracic and lumbar curves, round shoulders  UPPER EXTREMITY ROM: R shoulder mildly limited. B hands limited due to arthritis.  Passive ROM Right eval Left eval Left AROM 05/12/23 Left AROM 05/22/23  Shoulder flexion  110 137 162  Shoulder extension      Shoulder abduction  118 143 150  Shoulder adduction      Shoulder internal rotation  55 39 43  Shoulder external rotation  33 53 70  Elbow flexion      Elbow extension      Wrist flexion      Wrist extension      Wrist ulnar deviation      Wrist radial deviation      Wrist pronation      Wrist supination      (Blank rows = not tested)  UPPER EXTREMITY MMT: L shoulder movement testing all limited due to pain with resistance. Appears to be approximately 3+/5. R shoulder 3+/5, elbows 4/5.  SHOULDER SPECIAL TESTS: Impingement tests: Neer impingement test: positive  and Painful arc test: positive  Rotator cuff assessment: Empty can test: positive  and Belly press test: negative Biceps assessment: Yergason's test: positive  and Speed's test: positive   JOINT MOBILITY TESTING:  L humeral inf glide and post glide  WNL  PALPATION:  TTP in L UT, rhomboids, posterior shoulder joint capsule, long head of biceps  CERVICAL ROM:   Flexion, R and L lateral flexion all limited due to pain, ext, B rotation WFL   TODAY'S TREATMENT:                                                                                                                                         DATE:  05/22/23 NuStep L4 x 7 min AROM Seated Rows and & Lats 25lb 2x10 Shoulder Ext 5lb 2x10 3lb WaTE biceps curls 2x15 1lb shoulder abd 2x12 Shoulder Flex 2lb 2x10 PROM to LUE in all directions with end range holds  05/21/23 NuStep L4 x 7 minutes Seated Rows and & Lats 20lb 2x12 Shoulder Ext 5lb 2x10 1lb shoulder abd 2x12 Shoulder Flex 2lb 2x12 Biceps Curls 3lb WaTE 2x10 ER yellow 2x10 very weak  PROM to LUE in all directions with end range holds  05/15/23 NuStep L4 x 7 minutes Seated Rows and & Lats 20lb 2x10  Shoulder Ext green 2x12 AAROM 3lb WaTE  Flex, Ext, IR up back x10 IR LUE green 2x10 ER yellow 2x10 very weak  Shoulder abd 1lb x10 Shoulder flex 1 lb x10  PROM to LUE in all directions with end range holds  05/12/23 NuStep L4 x 7 minutes CHECKED ROM AAROM 2lb WaTE  Flex, Ext, IR  up back x10 Biceps Curls 2lb WaTE 2x10 Row & Ext green 2x10 IR LUE red 2x10 ER yellow 2x10 very weak  Shoulder abd 2lb 2x10 Shoulder flex 2lb 2x10  Triceps Ext 15lb 2x10 PROM to LUE in all directions with end range holds  05/08/23 NuStep L4 x 6 minutes Standing shoulder ext, rows, ER with R tband resistance Seated shoulder press ups off table. Standing wall slides into flexion and scaption, 10 reps each, BUE, held LUE to lower excursion STM to L UT rhomboids, pects, using massage gun F/B stretch Doorway stretch GH self inf glide in sitting with arm on table. Attempted multiple stretches for UT, but she was not able to tolerate any of them. 3 Way shoulder elevation with 1# weights x 10.  05/05/23 NuStep L 4 x 6 min AAROM 1lb WaTE   Flex, Ext, IR up back x10 LUE IR red  2x10  LUE ER 2x10 AROM Shoulder abduction 2x10 Rows and Ext green 2x10 L shoulder PROM in al directions, STM to upper L trap  05/01/23 UBE 2:30 forward, but then patient reported pain, so stopped. STM-Patient reports pain with shoulder elevation. She reports chronic neck pain as well.  Palpation- TTP and very tight throughout entire upper trunk, including cervical paraspinals, scalenes, rhomboids, around entire circumference of L scapular, Pects, and biceps tendon.  Therapist attempted STM to the area, she tolerated some of the treatment, at other times winced. Moved to scapular mobilizations and Pect stretch with same results, some relief, but minimal.  Passive shoulder flexion in sit with facilitation to L scap. Able to achieve > 90 with no report of pain, but on second attempt, patient and therapist both felt multiple pops, unable to identify exact location. She reports popping in both her neck and L shoulder when trying to move. Initiated active shoulder elevation with gentle wall slides into flexion. She was able to perform to just above 90 flexion, but then felt the popping. Attempted pendulum exercises, but she could not relax sufficiently to achieve true pendulum. Offered CP for inflammation after the STM, but patient declined.    PATIENT EDUCATION: Education details: POC Person educated: Patient, grandson Education method: Explanation Education comprehension: verbalized understanding  HOME EXERCISE PROGRAM: [id number]  ASSESSMENT:  CLINICAL IMPRESSION: Pt enters with reports of improvement. She has progressed increasing her L shoulder AORM.  Pain Tactile cues needed for posture withextensions. Good carryover with machine level interventions tolerating increase weight. Cues needed to rest more between sets. Pt did well with flexion and abduction but fatigues quick. Pt has good PROM  OBJECTIVE IMPAIRMENTS: decreased activity tolerance,  decreased mobility, decreased ROM, decreased strength, impaired flexibility, impaired UE functional use, improper body mechanics, postural dysfunction, and pain.   ACTIVITY LIMITATIONS: carrying, lifting, reach over head, and hygiene/grooming  PARTICIPATION LIMITATIONS: meal prep, cleaning, laundry, shopping, and community activity  PERSONAL FACTORS: Age and Past/current experiences are also affecting patient's functional outcome.   REHAB POTENTIAL: Good  CLINICAL DECISION MAKING: Stable/uncomplicated  EVALUATION COMPLEXITY: Low   GOALS: Goals reviewed with patient? Yes  SHORT TERM GOALS: Target date: 05/10/23  I with initial HEP Baseline: Goal status: 05/08/23-Provided HEP  LONG TERM GOALS: Target date: 07/02/23  I with final HEP Baseline:  Goal status: INITIAL  2.  Improve L shoulder PROM to at least equal to her R Baseline: flex 110, abd 118, IR 33 Goal status: Progressing 05/12/23  3.  Increase painfree L shoulder strength to at least 4/5, including scapula, in order to improve  postural support and control. Baseline:  Goal status: INITIAL  4.  Patient will report return to her normal daily activities, including cleaning, with L shoulder pain < 3/10 Baseline: 7/10 Goal status: Progressing 05/21/20 PLAN:  PT FREQUENCY: 1-2x/week  PT DURATION: 10 weeks  PLANNED INTERVENTIONS: Therapeutic exercises, Therapeutic activity, Neuromuscular re-education, Balance training, Gait training, Patient/Family education, Self Care, Joint mobilization, Dry Needling, Electrical stimulation, Spinal mobilization, Cryotherapy, Moist heat, Taping, Vasopneumatic device, Ionotophoresis 4mg /ml Dexamethasone, and Manual therapy  PLAN FOR NEXT SESSION: Initiate HEP   Iona Beard, DPT 05/22/2023, 1:47 PM

## 2023-05-28 ENCOUNTER — Ambulatory Visit: Payer: PPO | Attending: Family Medicine | Admitting: Physical Therapy

## 2023-05-28 ENCOUNTER — Encounter: Payer: Self-pay | Admitting: Physical Therapy

## 2023-05-28 DIAGNOSIS — G8929 Other chronic pain: Secondary | ICD-10-CM | POA: Insufficient documentation

## 2023-05-28 DIAGNOSIS — M25512 Pain in left shoulder: Secondary | ICD-10-CM | POA: Insufficient documentation

## 2023-05-28 DIAGNOSIS — R278 Other lack of coordination: Secondary | ICD-10-CM | POA: Insufficient documentation

## 2023-05-28 DIAGNOSIS — M6281 Muscle weakness (generalized): Secondary | ICD-10-CM | POA: Insufficient documentation

## 2023-05-28 DIAGNOSIS — R293 Abnormal posture: Secondary | ICD-10-CM | POA: Insufficient documentation

## 2023-05-28 NOTE — Therapy (Signed)
OUTPATIENT PHYSICAL THERAPY SHOULDER TREATMENT  Progress Note Reporting Period 04/23/23 to 05/28/23  See note below for Objective Data and Assessment of Progress/Goals.      Patient Name: Mary Hayes MRN: [id number] DOB:[birth year], 82 y.o., female Today's Date: 05/28/2023  END OF SESSION:  PT End of Session - 05/28/23 1428     Visit Number 10    Date for PT Re-Evaluation 07/02/23    PT Start Time 1430    PT Stop Time 1515    PT Time Calculation (min) 45 min             Past Medical History:  Diagnosis Date   Arthritis    Hypercholesteremia    Hypertension    Hypothyroidism    Sleep apnea    cpap   Past Surgical History:  Procedure Laterality Date   HAND SURGERY     right hand pinched nerve   JOINT REPLACEMENT Right    Knee   TONSILLECTOMY     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   Procedure: TOTAL KNEE ARTHROPLASTY;  Surgeon: Ollen Gross, MD;  Location: [facility];  Service: Orthopedics;  Laterality: Left;    Patient Active Problem List   Diagnosis Date Noted   Pain due to onychomycosis of toenails of both feet 05/07/2023   OA (osteoarthritis) of knee 01/11/2019    PCP: [facility]  REFERRING PROVIDER: Jackelyn Poling, DO  REFERRING DIAG: [phone] (ICD-10-CM) - Pain in left shoulder   THERAPY DIAG:  Chronic pain in left shoulder  Abnormal posture  Muscle weakness (generalized)  Rationale for Evaluation and Treatment: Rehabilitation  ONSET DATE: 04/09/23 2 SUBJECTIVE:                                                                                                                                                                                      SUBJECTIVE STATEMENT: "Im am ok"  Hand dominance: Left  PERTINENT HISTORY: Per referring Practitioner note: DM, Hypothyroidism Chronic L shoulder pain, exacerbated, decreased abd, TTP biceps tendon and post shoulder, (+) Yergason's and empty can-suspect RC injury.  PAIN:  Are you  having pain? Yes: NPRS scale: 4/10 Pain location: L shoulder Pain description: sharp Aggravating factors: elevation, reaching behind back Relieving factors: Pain meds help a bit, but make her sleepy.  PRECAUTIONS: None  WEIGHT BEARING RESTRICTIONS: No  FALLS:  Has patient fallen in last 6 months? No  LIVING ENVIRONMENT: Lives with: lives alone Lives in: House/apartment Stairs: Yes: External: 12 steps; can reach both Has following equipment at home: None  OCCUPATION: N/A-Interferes in housework.   PLOF: Independent  PATIENT GOALS:Decreased pain. Be able to do her housework  without pain.  NEXT MD VISIT:   OBJECTIVE:   DIAGNOSTIC FINDINGS:  N/A  COGNITION: Overall cognitive status: Within functional limits for tasks assessed     SENSATION: Patient reports T & N in hands, chronic  POSTURE: Flattened thoracic and lumbar curves, round shoulders  UPPER EXTREMITY ROM: R shoulder mildly limited. B hands limited due to arthritis.  Passive ROM Right eval Left eval Left AROM 05/12/23 Left AROM 05/22/23  Shoulder flexion  110 137 162  Shoulder extension      Shoulder abduction  118 143 150  Shoulder adduction      Shoulder internal rotation  55 39 43  Shoulder external rotation  33 53 70  Elbow flexion      Elbow extension      Wrist flexion      Wrist extension      Wrist ulnar deviation      Wrist radial deviation      Wrist pronation      Wrist supination      (Blank rows = not tested)  UPPER EXTREMITY MMT: L shoulder movement testing all limited due to pain with resistance. Appears to be approximately 3+/5. R shoulder 3+/5, elbows 4/5.  SHOULDER SPECIAL TESTS: Impingement tests: Neer impingement test: positive  and Painful arc test: positive  Rotator cuff assessment: Empty can test: positive  and Belly press test: negative Biceps assessment: Yergason's test: positive  and Speed's test: positive   JOINT MOBILITY TESTING:  L humeral inf glide and post glide  WNL  PALPATION:  TTP in L UT, rhomboids, posterior shoulder joint capsule, long head of biceps  CERVICAL ROM:   Flexion, R and L lateral flexion all limited due to pain, ext, B rotation WFL   TODAY'S TREATMENT:                                                                                                                                         DATE:  05/28/23 NuStep L4 x 7 min MMT LUE Flex & ER 4+/5, Abduction & IR 4/5 Seated Rows and & Lats 25lb 2x10 Shoulder Flex 2lb 2x12 Shoukder abd 1lb 2x12 Shoulder ER yellow  05/22/23 NuStep L4 x 7 min AROM Seated Rows and & Lats 25lb 2x10 Shoulder Ext 5lb 2x10 3lb WaTE biceps curls 2x15 1lb shoulder abd 2x12 Shoulder Flex 2lb 2x10 PROM to LUE in all directions with end range holds  05/21/23 NuStep L4 x 7 minutes Seated Rows and & Lats 20lb 2x12 Shoulder Ext 5lb 2x10 1lb shoulder abd 2x12 Shoulder Flex 2lb 2x12 Biceps Curls 3lb WaTE 2x10 ER yellow 2x10 very weak  PROM to LUE in all directions with end range holds  05/15/23 NuStep L4 x 7 minutes Seated Rows and & Lats 20lb 2x10  Shoulder Ext green 2x12 AAROM 3lb WaTE  Flex, Ext, IR up back x10 IR LUE green 2x10 ER yellow 2x10  very weak  Shoulder abd 1lb x10 Shoulder flex 1 lb x10  PROM to LUE in all directions with end range holds  05/12/23 NuStep L4 x 7 minutes CHECKED ROM AAROM 2lb WaTE  Flex, Ext, IR up back x10 Biceps Curls 2lb WaTE 2x10 Row & Ext green 2x10 IR LUE red 2x10 ER yellow 2x10 very weak  Shoulder abd 2lb 2x10 Shoulder flex 2lb 2x10  Triceps Ext 15lb 2x10 PROM to LUE in all directions with end range holds   PATIENT EDUCATION: Education details: POC Person educated: Patient, grandson Education method: Explanation Education comprehension: verbalized understanding  HOME EXERCISE PROGRAM: [id number]  ASSESSMENT:  CLINICAL IMPRESSION: Pt enters with reports of improvement.  Pain Tactile cues needed for posture with extensions. Good carryover with  machine level interventions. She has progressed increasing shoulder strength meeting goal. 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 Cues needed to rest more between sets. Pt continues to fatigue with flexion and abduction.  OBJECTIVE IMPAIRMENTS: decreased activity tolerance, decreased mobility, decreased ROM, decreased strength, impaired flexibility, impaired UE functional use, improper body mechanics, postural dysfunction, and pain.   ACTIVITY LIMITATIONS: carrying, lifting, reach over head, and hygiene/grooming  PARTICIPATION LIMITATIONS: meal prep, cleaning, laundry, shopping, and community activity  PERSONAL FACTORS: Age and Past/current experiences are also affecting patient's functional outcome.   REHAB POTENTIAL: Good  CLINICAL DECISION MAKING: Stable/uncomplicated  EVALUATION COMPLEXITY: Low   GOALS: Goals reviewed with patient? Yes  SHORT TERM GOALS: Target date: 05/10/23  I with initial HEP Baseline: Goal status: 05/28/23 Met  LONG TERM GOALS: Target date: 07/02/23  I with final HEP Baseline:  Goal status: INITIAL  2.  Improve L shoulder PROM to at least equal to her R Baseline: flex 110, abd 118, IR 33 Goal status: Progressing 05/28/23  3.  Increase painfree L shoulder strength to at least 4/5, including scapula, in order to improve postural support and control. Baseline:  Goal status: Met 05/28/23  4.  Patient will report return to her normal daily activities, including cleaning, with L shoulder pain < 3/10 Baseline: 7/10 Goal status: Progressing 05/22/23 PLAN:  PT FREQUENCY: 1-2x/week  PT DURATION: 10 weeks  PLANNED INTERVENTIONS: Therapeutic exercises, Therapeutic activity, Neuromuscular re-education, Balance training, Gait training, Patient/Family education, Self Care, Joint mobilization, Dry Needling, Electrical stimulation, Spinal mobilization, Cryotherapy, Moist heat, Taping, Vasopneumatic device, Ionotophoresis 4mg /ml Dexamethasone, and Manual  therapy  PLAN FOR NEXT SESSION: LUE functional strenght  Oley Balm DPT 05/28/23 3:42 PM   Debroah Baller, PTA 05/28/2023, 3:10 PM

## 2023-06-02 DIAGNOSIS — G4733 Obstructive sleep apnea (adult) (pediatric): Secondary | ICD-10-CM | POA: Diagnosis not present

## 2023-06-04 ENCOUNTER — Encounter: Payer: Self-pay | Admitting: Physical Therapy

## 2023-06-04 ENCOUNTER — Ambulatory Visit: Payer: PPO | Admitting: Physical Therapy

## 2023-06-04 DIAGNOSIS — G8929 Other chronic pain: Secondary | ICD-10-CM

## 2023-06-04 DIAGNOSIS — R278 Other lack of coordination: Secondary | ICD-10-CM

## 2023-06-04 DIAGNOSIS — M6281 Muscle weakness (generalized): Secondary | ICD-10-CM

## 2023-06-04 DIAGNOSIS — M25512 Pain in left shoulder: Secondary | ICD-10-CM | POA: Diagnosis not present

## 2023-06-04 DIAGNOSIS — R293 Abnormal posture: Secondary | ICD-10-CM

## 2023-06-04 NOTE — Therapy (Signed)
OUTPATIENT PHYSICAL THERAPY SHOULDER TREATMENT      Patient Name: Mary Hayes MRN: [id number] DOB:09/25/1941, 82 y.o., female Today's Date: 06/04/2023  END OF SESSION:  PT End of Session - 06/04/23 1345     Visit Number 11    Date for PT Re-Evaluation 07/02/23    PT Start Time 1345    PT Stop Time 1230    PT Time Calculation (min) 1365 min    Activity Tolerance Patient tolerated treatment well    Behavior During Therapy WFL for tasks assessed/performed             Past Medical History:  Diagnosis Date   Arthritis    Hypercholesteremia    Hypertension    Hypothyroidism    Sleep apnea    cpap   Past Surgical History:  Procedure Laterality Date   HAND SURGERY     right hand pinched nerve   JOINT REPLACEMENT Right    Knee   TONSILLECTOMY     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   Procedure: TOTAL KNEE ARTHROPLASTY;  Surgeon: Ollen Gross, MD;  Location: [facility];  Service: Orthopedics;  Laterality: Left;    Patient Active Problem List   Diagnosis Date Noted   Pain due to onychomycosis of toenails of both feet 05/07/2023   OA (osteoarthritis) of knee 01/11/2019    PCP: [facility]  REFERRING PROVIDER: Jackelyn Poling, DO  REFERRING DIAG: [phone] (ICD-10-CM) - Pain in left shoulder   THERAPY DIAG:  Chronic pain in left shoulder  Abnormal posture  Muscle weakness (generalized)  Other lack of coordination  Rationale for Evaluation and Treatment: Rehabilitation  ONSET DATE: 04/09/23 2 SUBJECTIVE:                                                                                                                                                                                      SUBJECTIVE STATEMENT: "Everything all right now"  Hand dominance: Left  PERTINENT HISTORY: Per referring Practitioner note: DM, Hypothyroidism Chronic L shoulder pain, exacerbated, decreased abd, TTP biceps tendon and post shoulder, (+) Yergason's and empty  can-suspect RC injury.  PAIN:  Are you having pain? Yes: NPRS scale: 0/10 Pain location: L shoulder Pain description: sharp Aggravating factors: elevation, reaching behind back Relieving factors: Pain meds help a bit, but make her sleepy.  PRECAUTIONS: None  WEIGHT BEARING RESTRICTIONS: No  FALLS:  Has patient fallen in last 6 months? No  LIVING ENVIRONMENT: Lives with: lives alone Lives in: House/apartment Stairs: Yes: External: 12 steps; can reach both Has following equipment at home: None  OCCUPATION: N/A-Interferes in housework.   PLOF: Independent  PATIENT GOALS:Decreased pain.  Be able to do her housework without pain.  NEXT MD VISIT:   OBJECTIVE:   DIAGNOSTIC FINDINGS:  N/A  COGNITION: Overall cognitive status: Within functional limits for tasks assessed     SENSATION: Patient reports T & N in hands, chronic  POSTURE: Flattened thoracic and lumbar curves, round shoulders  UPPER EXTREMITY ROM: R shoulder mildly limited. B hands limited due to arthritis.  Passive ROM Right eval Left eval Left AROM 05/12/23 Left AROM 05/22/23  Shoulder flexion  110 137 162  Shoulder extension      Shoulder abduction  118 143 150  Shoulder adduction      Shoulder internal rotation  55 39 43  Shoulder external rotation  33 53 70  Elbow flexion      Elbow extension      Wrist flexion      Wrist extension      Wrist ulnar deviation      Wrist radial deviation      Wrist pronation      Wrist supination      (Blank rows = not tested)  UPPER EXTREMITY MMT: L shoulder movement testing all limited due to pain with resistance. Appears to be approximately 3+/5. R shoulder 3+/5, elbows 4/5.  SHOULDER SPECIAL TESTS: Impingement tests: Neer impingement test: positive  and Painful arc test: positive  Rotator cuff assessment: Empty can test: positive  and Belly press test: negative Biceps assessment: Yergason's test: positive  and Speed's test: positive   JOINT MOBILITY  TESTING:  L humeral inf glide and post glide WNL  PALPATION:  TTP in L UT, rhomboids, posterior shoulder joint capsule, long head of biceps  CERVICAL ROM:   Flexion, R and L lateral flexion all limited due to pain, ext, B rotation WFL   TODAY'S TREATMENT:                                                                                                                                         DATE:  06/04/23 NuStep L4 x 7 min Seated Rows and & Lats 25lb 2x12 AAROM flex 3lb WaTE x10 Shoulder Abd 2lb 2x10 Shoulder Flex 2lb 2x12 Shoulder ER yellow LUE shoulder IR gree Shoulder Ext green  Seated OHP yellow ball 2x10 3lb WaTE biceps curls 2x15  05/28/23 NuStep L4 x 7 min MMT LUE Flex & ER 4+/5, Abduction & IR 4/5 Seated Rows and & Lats 25lb 2x10 Shoulder Flex 2lb 2x12 Shoukder abd 1lb 2x12 Shoulder ER yellow  05/22/23 NuStep L4 x 7 min AROM Seated Rows and & Lats 25lb 2x10 Shoulder Ext 5lb 2x10 3lb WaTE biceps curls 2x15 1lb shoulder abd 2x12 Shoulder Flex 2lb 2x10 PROM to LUE in all directions with end range holds  05/21/23 NuStep L4 x 7 minutes Seated Rows and & Lats 20lb 2x12 Shoulder Ext 5lb 2x10 1lb shoulder abd 2x12 Shoulder Flex 2lb 2x12 Biceps Curls 3lb  WaTE 2x10 ER yellow 2x10 very weak  PROM to LUE in all directions with end range holds  05/15/23 NuStep L4 x 7 minutes Seated Rows and & Lats 20lb 2x10  Shoulder Ext green 2x12 AAROM 3lb WaTE  Flex, Ext, IR up back x10 IR LUE green 2x10 ER yellow 2x10 very weak  Shoulder abd 1lb x10 Shoulder flex 1 lb x10  PROM to LUE in all directions with end range holds  05/12/23 NuStep L4 x 7 minutes CHECKED ROM AAROM 2lb WaTE  Flex, Ext, IR up back x10 Biceps Curls 2lb WaTE 2x10 Row & Ext green 2x10 IR LUE red 2x10 ER yellow 2x10 very weak  Shoulder abd 2lb 2x10 Shoulder flex 2lb 2x10  Triceps Ext 15lb 2x10 PROM to LUE in all directions with end range holds   PATIENT EDUCATION: Education details: POC Person  educated: Patient, grandson Education method: Explanation Education comprehension: verbalized understanding  HOME EXERCISE PROGRAM: [email or web address]  ASSESSMENT:  CLINICAL IMPRESSION: Pt enters doing well. Tactile cues needed to keep L elbow to side with internals and external rotation. Good carryover with machine level interventions. She still reports pain at home with ADL at times. Cues needed to rest more between sets. Pt continues to fatigue with flexion and abduction. Grinding noise from L shoulder with horiz abd  OBJECTIVE IMPAIRMENTS: decreased activity tolerance, decreased mobility, decreased ROM, decreased strength, impaired flexibility, impaired UE functional use, improper body mechanics, postural dysfunction, and pain.   ACTIVITY LIMITATIONS: carrying, lifting, reach over head, and hygiene/grooming  PARTICIPATION LIMITATIONS: meal prep, cleaning, laundry, shopping, and community activity  PERSONAL FACTORS: Age and Past/current experiences are also affecting patient's functional outcome.   REHAB POTENTIAL: Good  CLINICAL DECISION MAKING: Stable/uncomplicated  EVALUATION COMPLEXITY: Low   GOALS: Goals reviewed with patient? Yes  SHORT TERM GOALS: Target date: 05/10/23  I with initial HEP Baseline: Goal status: 05/28/23 Met  LONG TERM GOALS: Target date: 07/02/23  I with final HEP Baseline:  Goal status: INITIAL  2.  Improve L shoulder PROM to at least equal to her R Baseline: flex 110, abd 118, IR 33 Goal status: Progressing 05/28/23  3.  Increase painfree L shoulder strength to at least 4/5, including scapula, in order to improve postural support and control. Baseline:  Goal status: Met 05/28/23  4.  Patient will report return to her normal daily activities, including cleaning, with L shoulder pain < 3/10 Baseline: 7/10 Goal status: Progressing 05/22/23 PLAN:  PT FREQUENCY: 1-2x/week  PT DURATION: 10 weeks  PLANNED INTERVENTIONS: Therapeutic exercises, Therapeutic  activity, Neuromuscular re-education, Balance training, Gait training, Patient/Family education, Self Care, Joint mobilization, Dry Needling, Electrical stimulation, Spinal mobilization, Cryotherapy, Moist heat, Taping, Vasopneumatic device, Ionotophoresis 4mg /ml Dexamethasone, and Manual therapy  PLAN FOR NEXT SESSION: Possible D/C   Debroah Baller, PTA 06/04/2023, 1:46 PM

## 2023-06-11 ENCOUNTER — Ambulatory Visit: Payer: PPO | Admitting: Physical Therapy

## 2023-06-11 ENCOUNTER — Encounter: Payer: Self-pay | Admitting: Physical Therapy

## 2023-06-11 DIAGNOSIS — M25512 Pain in left shoulder: Secondary | ICD-10-CM | POA: Diagnosis not present

## 2023-06-11 DIAGNOSIS — M6281 Muscle weakness (generalized): Secondary | ICD-10-CM

## 2023-06-11 DIAGNOSIS — R293 Abnormal posture: Secondary | ICD-10-CM

## 2023-06-11 DIAGNOSIS — G8929 Other chronic pain: Secondary | ICD-10-CM

## 2023-06-11 NOTE — Therapy (Signed)
OUTPATIENT PHYSICAL THERAPY SHOULDER TREATMENT    PHYSICAL THERAPY DISCHARGE SUMMARY  Visits from Start of Care: 12  Current functional level related to goals / functional outcomes: Goals partially met   Remaining deficits: Mild discomfort in shoulder   Education / Equipment: HEP   Patient agrees to discharge. Patient goals were partially met. Patient is being discharged due to being pleased with the current functional level.   Patient Name: Mary Hayes MRN: [id number] DOB:19-Dec-1941, 82 y.o., female Today's Date: 06/11/2023  END OF SESSION:  PT End of Session - 06/11/23 1424     Visit Number 12    Date for PT Re-Evaluation 07/02/23    PT Start Time 1425    PT Stop Time 1510    PT Time Calculation (min) 45 min    Activity Tolerance Patient tolerated treatment well    Behavior During Therapy WFL for tasks assessed/performed             Past Medical History:  Diagnosis Date   Arthritis    Hypercholesteremia    Hypertension    Hypothyroidism    Sleep apnea    cpap   Past Surgical History:  Procedure Laterality Date   HAND SURGERY     right hand pinched nerve   JOINT REPLACEMENT Right    Knee   TONSILLECTOMY     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   Procedure: TOTAL KNEE ARTHROPLASTY;  Surgeon: Ollen Gross, MD;  Location: [facility];  Service: Orthopedics;  Laterality: Left;    Patient Active Problem List   Diagnosis Date Noted   Pain due to onychomycosis of toenails of both feet 05/07/2023   OA (osteoarthritis) of knee 01/11/2019    [facility]P: [facility]  REFERRING PROVIDER: Jackelyn Poling, DO  REFERRING DIAG: [phone] (ICD-10-CM) - Pain in left shoulder   THERAPY DIAG:  Chronic pain in left shoulder  Muscle weakness (generalized)  Abnormal posture  Rationale for Evaluation and Treatment: Rehabilitation  ONSET DATE: 04/09/23 2 SUBJECTIVE:                                                                                                                                                                                       SUBJECTIVE STATEMENT: Doing ok, going on vacation  Hand dominance: Left  PERTINENT HISTORY: Per referring Practitioner note: DM, Hypothyroidism Chronic L shoulder pain, exacerbated, decreased abd, TTP biceps tendon and post shoulder, (+) Yergason's and empty can-suspect RC injury.  PAIN:  Are you having pain? Yes: NPRS scale: 0/10 Pain location: L shoulder Pain description: sharp Aggravating factors: elevation, reaching behind back Relieving factors: Pain meds help a bit, but make  her sleepy.  PRECAUTIONS: None  WEIGHT BEARING RESTRICTIONS: No  FALLS:  Has patient fallen in last 6 months? No  LIVING ENVIRONMENT: Lives with: lives alone Lives in: House/apartment Stairs: Yes: External: 12 steps; can reach both Has following equipment at home: None  OCCUPATION: N/A-Interferes in housework.   PLOF: Independent  PATIENT GOALS:Decreased pain. Be able to do her housework without pain.  NEXT MD VISIT:   OBJECTIVE:   DIAGNOSTIC FINDINGS:  N/A  COGNITION: Overall cognitive status: Within functional limits for tasks assessed     SENSATION: Patient reports T & N in hands, chronic  POSTURE: Flattened thoracic and lumbar curves, round shoulders  UPPER EXTREMITY ROM: R shoulder mildly limited. B hands limited due to arthritis.  Passive ROM Right eval Left eval Left AROM 05/12/23 Left AROM 05/22/23  Shoulder flexion  110 137 162  Shoulder extension      Shoulder abduction  118 143 150  Shoulder adduction      Shoulder internal rotation  55 39 43  Shoulder external rotation  33 53 70  Elbow flexion      Elbow extension      Wrist flexion      Wrist extension      Wrist ulnar deviation      Wrist radial deviation      Wrist pronation      Wrist supination      (Blank rows = not tested)  UPPER EXTREMITY MMT: L shoulder movement testing all limited due to pain with  resistance. Appears to be approximately 3+/5. R shoulder 3+/5, elbows 4/5.  SHOULDER SPECIAL TESTS: Impingement tests: Neer impingement test: positive  and Painful arc test: positive  Rotator cuff assessment: Empty can test: positive  and Belly press test: negative Biceps assessment: Yergason's test: positive  and Speed's test: positive   JOINT MOBILITY TESTING:  L humeral inf glide and post glide WNL  PALPATION:  TTP in L UT, rhomboids, posterior shoulder joint capsule, long head of biceps  CERVICAL ROM:   Flexion, R and L lateral flexion all limited due to pain, ext, B rotation WFL   TODAY'S TREATMENT:                                                                                                                                         DATE:  06/11/23 NuStep L4 x 7 min Seated Rows and & Lats 25lb 2x12 AAROM flex 3lb WaTE x10 Biceps Curls 3lb WaTE 2x10 Shoulder Abd 2lb 2x10 Shoulder Ext green 2x12 Shoulder ER yellow Shoulder IR yellow RUE 2x10 Seated OHP yellow ball 2x10  06/04/23 NuStep L4 x 7 min Seated Rows and & Lats 25lb 2x12 AAROM flex 3lb WaTE x10 Shoulder Abd 2lb 2x10 Shoulder Flex 2lb 2x12 Shoulder ER yellow LUE shoulder IR gree Shoulder Ext green  3lb WaTE biceps curls 2x15  05/28/23 NuStep L4 x 7 min MMT  LUE Flex & ER 4+/5, Abduction & IR 4/5 Seated Rows and & Lats 25lb 2x10 Shoulder Flex 2lb 2x12 Shoukder abd 1lb 2x12 Shoulder ER yellow  05/22/23 NuStep L4 x 7 min AROM Seated Rows and & Lats 25lb 2x10 Shoulder Ext 5lb 2x10 3lb WaTE biceps curls 2x15 1lb shoulder abd 2x12 Shoulder Flex 2lb 2x10 PROM to LUE in all directions with end range holds  05/21/23 NuStep L4 x 7 minutes Seated Rows and & Lats 20lb 2x12 Shoulder Ext 5lb 2x10 1lb shoulder abd 2x12 Shoulder Flex 2lb 2x12 Biceps Curls 3lb WaTE 2x10 ER yellow 2x10 very weak  PROM to LUE in all directions with end range holds  05/15/23 NuStep L4 x 7 minutes Seated Rows and & Lats 20lb 2x10   Shoulder Ext green 2x12 AAROM 3lb WaTE  Flex, Ext, IR up back x10 IR LUE green 2x10 ER yellow 2x10 very weak  Shoulder abd 1lb x10 Shoulder flex 1 lb x10  PROM to LUE in all directions with end range holds  05/12/23 NuStep L4 x 7 minutes CHECKED ROM AAROM 2lb WaTE  Flex, Ext, IR up back x10 Biceps Curls 2lb WaTE 2x10 Row & Ext green 2x10 IR LUE red 2x10 ER yellow 2x10 very weak  Shoulder abd 2lb 2x10 Shoulder flex 2lb 2x10  Triceps Ext 15lb 2x10 PROM to LUE in all directions with end range holds   PATIENT EDUCATION: Education details: POC Person educated: Patient, grandson Education method: Explanation Education comprehension: verbalized understanding  HOME EXERCISE PROGRAM: [id number]  ASSESSMENT:  CLINICAL IMPRESSION: Pt enters doing well pleased with her current functional status. Some difficulty with shoulder abduction due to discomfort and weakness. Pt reports no functional limitations. OBJECTIVE IMPAIRMENTS: decreased activity tolerance, decreased mobility, decreased ROM, decreased strength, impaired flexibility, impaired UE functional use, improper body mechanics, postural dysfunction, and pain.   ACTIVITY LIMITATIONS: carrying, lifting, reach over head, and hygiene/grooming  PARTICIPATION LIMITATIONS: meal prep, cleaning, laundry, shopping, and community activity  PERSONAL FACTORS: Age and Past/current experiences are also affecting patient's functional outcome.   REHAB POTENTIAL: Good  CLINICAL DECISION MAKING: Stable/uncomplicated  EVALUATION COMPLEXITY: Low   GOALS: Goals reviewed with patient? Yes  SHORT TERM GOALS: Target date: 05/10/23  I with initial HEP Baseline: Goal status: 05/28/23 Met  LONG TERM GOALS: Target date: 07/02/23  I with final HEP Baseline:  Goal status: Met  2.  Improve L shoulder PROM to at least equal to her R Baseline: flex 110, abd 118, IR 33 Goal status: Progressing 05/28/23  3.  Increase painfree L shoulder strength  to at least 4/5, including scapula, in order to improve postural support and control. Baseline:  Goal status: Met 05/28/23  4.  Patient will report return to her normal daily activities, including cleaning, with L shoulder pain < 3/10 Baseline: 7/10 Goal status: Progressing 05/22/23 PLAN:  PT FREQUENCY: 1-2x/week  PT DURATION: 10 weeks  PLANNED INTERVENTIONS: Therapeutic exercises, Therapeutic activity, Neuromuscular re-education, Balance training, Gait training, Patient/Family education, Self Care, Joint mobilization, Dry Needling, Electrical stimulation, Spinal mobilization, Cryotherapy, Moist heat, Taping, Vasopneumatic device, Ionotophoresis 4mg /ml Dexamethasone, and Manual therapy  PLAN FOR NEXT SESSION: Possible D/C  PHYSICAL THERAPY DISCHARGE SUMMARY  Visits from Start of Care: 12   Patient agrees to discharge. Patient goals were partially met. Patient is being discharged due to being pleased with the current functional level.  Oley Balm DPT 06/11/23 4:34 PM   Debroah Baller, PTA 06/11/2023, 2:25 PM

## 2023-07-02 DIAGNOSIS — G4733 Obstructive sleep apnea (adult) (pediatric): Secondary | ICD-10-CM | POA: Diagnosis not present

## 2023-07-14 DIAGNOSIS — J329 Chronic sinusitis, unspecified: Secondary | ICD-10-CM | POA: Diagnosis not present

## 2023-07-14 DIAGNOSIS — J40 Bronchitis, not specified as acute or chronic: Secondary | ICD-10-CM | POA: Diagnosis not present

## 2023-07-23 DIAGNOSIS — K219 Gastro-esophageal reflux disease without esophagitis: Secondary | ICD-10-CM | POA: Diagnosis not present

## 2023-07-23 DIAGNOSIS — M810 Age-related osteoporosis without current pathological fracture: Secondary | ICD-10-CM | POA: Diagnosis not present

## 2023-07-23 DIAGNOSIS — E785 Hyperlipidemia, unspecified: Secondary | ICD-10-CM | POA: Diagnosis not present

## 2023-07-23 DIAGNOSIS — E039 Hypothyroidism, unspecified: Secondary | ICD-10-CM | POA: Diagnosis not present

## 2023-07-23 DIAGNOSIS — I10 Essential (primary) hypertension: Secondary | ICD-10-CM | POA: Diagnosis not present

## 2023-07-23 DIAGNOSIS — G4733 Obstructive sleep apnea (adult) (pediatric): Secondary | ICD-10-CM | POA: Diagnosis not present

## 2023-07-23 DIAGNOSIS — E669 Obesity, unspecified: Secondary | ICD-10-CM | POA: Diagnosis not present

## 2023-08-02 DIAGNOSIS — G4733 Obstructive sleep apnea (adult) (pediatric): Secondary | ICD-10-CM | POA: Diagnosis not present

## 2023-08-06 ENCOUNTER — Ambulatory Visit: Payer: PPO | Admitting: Podiatry

## 2023-08-27 DIAGNOSIS — H04123 Dry eye syndrome of bilateral lacrimal glands: Secondary | ICD-10-CM | POA: Diagnosis not present

## 2023-08-27 DIAGNOSIS — H353131 Nonexudative age-related macular degeneration, bilateral, early dry stage: Secondary | ICD-10-CM | POA: Diagnosis not present

## 2023-08-27 DIAGNOSIS — H35373 Puckering of macula, bilateral: Secondary | ICD-10-CM | POA: Diagnosis not present

## 2023-09-02 DIAGNOSIS — G4733 Obstructive sleep apnea (adult) (pediatric): Secondary | ICD-10-CM | POA: Diagnosis not present

## 2023-10-02 DIAGNOSIS — G4733 Obstructive sleep apnea (adult) (pediatric): Secondary | ICD-10-CM | POA: Diagnosis not present

## 2023-10-15 DIAGNOSIS — G4730 Sleep apnea, unspecified: Secondary | ICD-10-CM | POA: Diagnosis not present

## 2023-10-15 DIAGNOSIS — Z23 Encounter for immunization: Secondary | ICD-10-CM | POA: Diagnosis not present

## 2023-10-15 DIAGNOSIS — E119 Type 2 diabetes mellitus without complications: Secondary | ICD-10-CM | POA: Diagnosis not present

## 2023-10-15 DIAGNOSIS — E782 Mixed hyperlipidemia: Secondary | ICD-10-CM | POA: Diagnosis not present

## 2023-10-15 DIAGNOSIS — M1990 Unspecified osteoarthritis, unspecified site: Secondary | ICD-10-CM | POA: Diagnosis not present

## 2023-10-15 DIAGNOSIS — I10 Essential (primary) hypertension: Secondary | ICD-10-CM | POA: Diagnosis not present

## 2023-10-15 DIAGNOSIS — E559 Vitamin D deficiency, unspecified: Secondary | ICD-10-CM | POA: Diagnosis not present

## 2023-10-15 DIAGNOSIS — Z0000 Encounter for general adult medical examination without abnormal findings: Secondary | ICD-10-CM | POA: Diagnosis not present

## 2023-10-15 DIAGNOSIS — M810 Age-related osteoporosis without current pathological fracture: Secondary | ICD-10-CM | POA: Diagnosis not present

## 2023-11-02 DIAGNOSIS — G4733 Obstructive sleep apnea (adult) (pediatric): Secondary | ICD-10-CM | POA: Diagnosis not present

## 2023-11-05 DIAGNOSIS — Z1231 Encounter for screening mammogram for malignant neoplasm of breast: Secondary | ICD-10-CM | POA: Diagnosis not present

## 2023-12-02 DIAGNOSIS — G4733 Obstructive sleep apnea (adult) (pediatric): Secondary | ICD-10-CM | POA: Diagnosis not present

## 2024-01-02 DIAGNOSIS — G4733 Obstructive sleep apnea (adult) (pediatric): Secondary | ICD-10-CM | POA: Diagnosis not present

## 2024-03-03 DIAGNOSIS — R051 Acute cough: Secondary | ICD-10-CM | POA: Diagnosis not present

## 2024-03-03 DIAGNOSIS — J205 Acute bronchitis due to respiratory syncytial virus: Secondary | ICD-10-CM | POA: Diagnosis not present

## 2024-03-05 ENCOUNTER — Inpatient Hospital Stay (HOSPITAL_COMMUNITY)
Admission: EM | Admit: 2024-03-05 | Discharge: 2024-03-07 | DRG: 193 | Disposition: A | Discharge status: Home / Self Care | Attending: Internal Medicine | Admitting: Internal Medicine

## 2024-03-05 ENCOUNTER — Encounter (HOSPITAL_COMMUNITY): Payer: Self-pay

## 2024-03-05 ENCOUNTER — Emergency Department (HOSPITAL_COMMUNITY)

## 2024-03-05 DIAGNOSIS — E1141 Type 2 diabetes mellitus with diabetic mononeuropathy: Secondary | ICD-10-CM | POA: Diagnosis not present

## 2024-03-05 DIAGNOSIS — G4733 Obstructive sleep apnea (adult) (pediatric): Secondary | ICD-10-CM | POA: Insufficient documentation

## 2024-03-05 DIAGNOSIS — B974 Respiratory syncytial virus as the cause of diseases classified elsewhere: Secondary | ICD-10-CM | POA: Diagnosis not present

## 2024-03-05 DIAGNOSIS — R269 Unspecified abnormalities of gait and mobility: Secondary | ICD-10-CM | POA: Diagnosis not present

## 2024-03-05 DIAGNOSIS — Z888 Allergy status to other drugs, medicaments and biological substances status: Secondary | ICD-10-CM | POA: Diagnosis not present

## 2024-03-05 DIAGNOSIS — J45901 Unspecified asthma with (acute) exacerbation: Secondary | ICD-10-CM | POA: Diagnosis not present

## 2024-03-05 DIAGNOSIS — E7849 Other hyperlipidemia: Secondary | ICD-10-CM | POA: Diagnosis not present

## 2024-03-05 DIAGNOSIS — Z1152 Encounter for screening for COVID-19: Secondary | ICD-10-CM

## 2024-03-05 DIAGNOSIS — E038 Other specified hypothyroidism: Secondary | ICD-10-CM

## 2024-03-05 DIAGNOSIS — G894 Chronic pain syndrome: Secondary | ICD-10-CM | POA: Diagnosis not present

## 2024-03-05 DIAGNOSIS — E039 Hypothyroidism, unspecified: Secondary | ICD-10-CM | POA: Diagnosis not present

## 2024-03-05 DIAGNOSIS — E119 Type 2 diabetes mellitus without complications: Secondary | ICD-10-CM | POA: Diagnosis not present

## 2024-03-05 DIAGNOSIS — J9601 Acute respiratory failure with hypoxia: Secondary | ICD-10-CM

## 2024-03-05 DIAGNOSIS — E785 Hyperlipidemia, unspecified: Secondary | ICD-10-CM | POA: Insufficient documentation

## 2024-03-05 DIAGNOSIS — I10 Essential (primary) hypertension: Secondary | ICD-10-CM | POA: Insufficient documentation

## 2024-03-05 DIAGNOSIS — B338 Other specified viral diseases: Secondary | ICD-10-CM | POA: Diagnosis not present

## 2024-03-05 DIAGNOSIS — Z79899 Other long term (current) drug therapy: Secondary | ICD-10-CM

## 2024-03-05 DIAGNOSIS — E669 Obesity, unspecified: Secondary | ICD-10-CM | POA: Diagnosis not present

## 2024-03-05 DIAGNOSIS — R918 Other nonspecific abnormal finding of lung field: Secondary | ICD-10-CM | POA: Diagnosis not present

## 2024-03-05 DIAGNOSIS — Z6831 Body mass index (BMI) 31.0-31.9, adult: Secondary | ICD-10-CM | POA: Diagnosis not present

## 2024-03-05 DIAGNOSIS — R0603 Acute respiratory distress: Secondary | ICD-10-CM | POA: Diagnosis present

## 2024-03-05 DIAGNOSIS — J4521 Mild intermittent asthma with (acute) exacerbation: Secondary | ICD-10-CM | POA: Diagnosis not present

## 2024-03-05 DIAGNOSIS — E7800 Pure hypercholesterolemia, unspecified: Secondary | ICD-10-CM | POA: Diagnosis present

## 2024-03-05 DIAGNOSIS — R531 Weakness: Secondary | ICD-10-CM | POA: Diagnosis not present

## 2024-03-05 DIAGNOSIS — Z79890 Hormone replacement therapy (postmenopausal): Secondary | ICD-10-CM | POA: Diagnosis not present

## 2024-03-05 DIAGNOSIS — Z7984 Long term (current) use of oral hypoglycemic drugs: Secondary | ICD-10-CM | POA: Diagnosis not present

## 2024-03-05 DIAGNOSIS — E876 Hypokalemia: Secondary | ICD-10-CM | POA: Diagnosis not present

## 2024-03-05 DIAGNOSIS — Z96652 Presence of left artificial knee joint: Secondary | ICD-10-CM | POA: Diagnosis present

## 2024-03-05 DIAGNOSIS — J189 Pneumonia, unspecified organism: Principal | ICD-10-CM

## 2024-03-05 DIAGNOSIS — E871 Hypo-osmolality and hyponatremia: Secondary | ICD-10-CM | POA: Diagnosis present

## 2024-03-05 DIAGNOSIS — R0602 Shortness of breath: Secondary | ICD-10-CM | POA: Diagnosis not present

## 2024-03-05 DIAGNOSIS — M47817 Spondylosis without myelopathy or radiculopathy, lumbosacral region: Secondary | ICD-10-CM | POA: Diagnosis present

## 2024-03-05 LAB — TROPONIN I (HIGH SENSITIVITY)
Troponin I (High Sensitivity): 9 ng/L (ref <18)
Troponin I (High Sensitivity): 57 ng/L — ABNORMAL HIGH (ref <18)

## 2024-03-05 LAB — COMPREHENSIVE METABOLIC PANEL
ALT: 32 U/L (ref 0–44)
AST: 38 U/L (ref 15–41)
Albumin: 3.6 g/dL (ref 3.5–5.0)
Alkaline Phosphatase: 35 U/L — ABNORMAL LOW (ref 38–126)
Anion gap: 13 (ref 5–15)
BUN: 11 mg/dL (ref 8–23)
CO2: 23 mmol/L (ref 22–32)
Calcium: 8.2 mg/dL — ABNORMAL LOW (ref 8.9–10.3)
Chloride: 96 mmol/L — ABNORMAL LOW (ref 98–111)
Creatinine, Ser: 0.63 mg/dL (ref 0.44–1.00)
GFR, Estimated: >60 mL/min (ref >60)
Glucose, Bld: 200 mg/dL — ABNORMAL HIGH (ref 70–99)
Potassium: 2.5 mmol/L — CL (ref 3.5–5.1)
Sodium: 132 mmol/L — ABNORMAL LOW (ref 135–145)
Total Bilirubin: 0.7 mg/dL (ref 0.0–1.2)
Total Protein: 7 g/dL (ref 6.5–8.1)

## 2024-03-05 LAB — CBC WITH DIFFERENTIAL/PLATELET
Abs Immature Granulocytes: 0.04 10*3/uL (ref 0.00–0.07)
Basophils Absolute: 0.1 10*3/uL (ref 0.0–0.1)
Basophils Relative: 1 %
Eosinophils Absolute: 0.1 10*3/uL (ref 0.0–0.5)
Eosinophils Relative: 1 %
HCT: 39.1 % (ref 36.0–46.0)
Hemoglobin: 12.7 g/dL (ref 12.0–15.0)
Immature Granulocytes: 1 %
Lymphocytes Relative: 27 %
Lymphs Abs: 2.3 10*3/uL (ref 0.7–4.0)
MCH: 28.4 pg (ref 26.0–34.0)
MCHC: 32.5 g/dL (ref 30.0–36.0)
MCV: 87.5 fL (ref 80.0–100.0)
Monocytes Absolute: 1.2 10*3/uL — ABNORMAL HIGH (ref 0.1–1.0)
Monocytes Relative: 14 %
Neutro Abs: 4.8 10*3/uL (ref 1.7–7.7)
Neutrophils Relative %: 56 %
Platelets: 240 10*3/uL (ref 150–400)
RBC: 4.47 MIL/uL (ref 3.87–5.11)
RDW: 13.4 % (ref 11.5–15.5)
WBC: 8.4 10*3/uL (ref 4.0–10.5)
nRBC: 0 % (ref 0.0–0.2)

## 2024-03-05 LAB — RESP PANEL BY RT-PCR (RSV, FLU A&B, COVID)  RVPGX2
Influenza A by PCR: NEGATIVE
Influenza B by PCR: NEGATIVE
Resp Syncytial Virus by PCR: POSITIVE — AB
SARS Coronavirus 2 by RT PCR: NEGATIVE

## 2024-03-05 LAB — MAGNESIUM: Magnesium: 2.1 mg/dL (ref 1.7–2.4)

## 2024-03-05 LAB — PROCALCITONIN: Procalcitonin: <0.1 ng/mL

## 2024-03-05 LAB — CBG MONITORING, ED
Glucose-Capillary: 276 mg/dL — ABNORMAL HIGH (ref 70–99)
Glucose-Capillary: 231 mg/dL — ABNORMAL HIGH (ref 70–99)
Glucose-Capillary: 265 mg/dL — ABNORMAL HIGH (ref 70–99)

## 2024-03-05 LAB — GLUCOSE, CAPILLARY
Glucose-Capillary: 254 mg/dL — ABNORMAL HIGH (ref 70–99)
Glucose-Capillary: 224 mg/dL — ABNORMAL HIGH (ref 70–99)

## 2024-03-05 LAB — HEMOGLOBIN A1C
Hgb A1c MFr Bld: 6.5 % — ABNORMAL HIGH (ref 4.8–5.6)
Mean Plasma Glucose: 139.85 mg/dL

## 2024-03-05 LAB — OSMOLALITY: Osmolality: 285 mosm/kg (ref 275–295)

## 2024-03-05 LAB — BRAIN NATRIURETIC PEPTIDE: B Natriuretic Peptide: 68.8 pg/mL (ref 0.0–100.0)

## 2024-03-05 MED ORDER — ACETAMINOPHEN 325 MG PO TABS: 650.0000 mg | ORAL_TABLET | Freq: Four times a day (QID) | ORAL | Status: DC | PRN

## 2024-03-05 MED ORDER — SODIUM CHLORIDE 0.9 % IV SOLN: 500.0000 mg | INTRAVENOUS | Status: DC

## 2024-03-05 MED ORDER — ACETAMINOPHEN 650 MG RE SUPP: 650.0000 mg | Freq: Four times a day (QID) | RECTAL | Status: DC | PRN

## 2024-03-05 MED ORDER — ATORVASTATIN CALCIUM 10 MG PO TABS
10.0000 mg | ORAL_TABLET | Freq: Every day | ORAL | Status: DC
  Administered 2024-03-05 – 2024-03-07 (×3): 10 mg via ORAL
  Filled 2024-03-05 (×3): qty 1

## 2024-03-05 MED ORDER — IPRATROPIUM-ALBUTEROL 0.5-2.5 (3) MG/3ML IN SOLN
9.0000 mL | Freq: Once | RESPIRATORY_TRACT | Status: AC
  Administered 2024-03-05: 9 mL via RESPIRATORY_TRACT
  Filled 2024-03-05: qty 9

## 2024-03-05 MED ORDER — INSULIN ASPART 100 UNIT/ML IJ SOLN
0.0000 [IU] | Freq: Three times a day (TID) | INTRAMUSCULAR | Status: DC
  Administered 2024-03-05: 3 [IU] via SUBCUTANEOUS
  Administered 2024-03-05: 2 [IU] via SUBCUTANEOUS
  Administered 2024-03-05: 3 [IU] via SUBCUTANEOUS
  Administered 2024-03-06: 2 [IU] via SUBCUTANEOUS
  Filled 2024-03-05: qty 0.06

## 2024-03-05 MED ORDER — SODIUM CHLORIDE 0.9% FLUSH
3.0000 mL | Freq: Two times a day (BID) | INTRAVENOUS | Status: DC
  Administered 2024-03-05 – 2024-03-06 (×4): 3 mL via INTRAVENOUS

## 2024-03-05 MED ORDER — SODIUM CHLORIDE 0.9 % IV SOLN
2.0000 g | INTRAVENOUS | Status: DC
  Administered 2024-03-06 – 2024-03-07 (×2): 2 g via INTRAVENOUS
  Filled 2024-03-05 (×2): qty 20

## 2024-03-05 MED ORDER — POTASSIUM CHLORIDE CRYS ER 20 MEQ PO TBCR
40.0000 meq | EXTENDED_RELEASE_TABLET | ORAL | Status: AC
  Administered 2024-03-05: 40 meq via ORAL
  Filled 2024-03-05: qty 2

## 2024-03-05 MED ORDER — IPRATROPIUM-ALBUTEROL 0.5-2.5 (3) MG/3ML IN SOLN
3.0000 mL | Freq: Once | RESPIRATORY_TRACT | Status: AC
  Administered 2024-03-05: 3 mL via RESPIRATORY_TRACT
  Filled 2024-03-05: qty 3

## 2024-03-05 MED ORDER — ACETYLCYSTEINE 20 % IN SOLN
3.0000 mL | Freq: Two times a day (BID) | RESPIRATORY_TRACT | Status: DC
  Filled 2024-03-05: qty 4

## 2024-03-05 MED ORDER — SODIUM CHLORIDE 0.9% FLUSH
3.0000 mL | Freq: Two times a day (BID) | INTRAVENOUS | Status: DC
  Administered 2024-03-05 – 2024-03-06 (×3): 3 mL via INTRAVENOUS

## 2024-03-05 MED ORDER — ONDANSETRON HCL 4 MG PO TABS: 4.0000 mg | ORAL_TABLET | Freq: Four times a day (QID) | ORAL | Status: DC | PRN

## 2024-03-05 MED ORDER — SODIUM CHLORIDE 0.9 % IV SOLN
500.0000 mg | Freq: Once | INTRAVENOUS | Status: AC
  Administered 2024-03-05: 500 mg via INTRAVENOUS
  Filled 2024-03-05: qty 5

## 2024-03-05 MED ORDER — LOSARTAN POTASSIUM 50 MG PO TABS: 50.0000 mg | ORAL_TABLET | Freq: Every day | ORAL | Status: DC

## 2024-03-05 MED ORDER — PANTOPRAZOLE SODIUM 40 MG PO TBEC
40.0000 mg | DELAYED_RELEASE_TABLET | Freq: Every day | ORAL | Status: DC
  Administered 2024-03-05 – 2024-03-07 (×3): 40 mg via ORAL
  Filled 2024-03-05 (×3): qty 1

## 2024-03-05 MED ORDER — SODIUM CHLORIDE 0.9 % IV BOLUS
500.0000 mL | Freq: Once | INTRAVENOUS | Status: AC
  Administered 2024-03-05: 500 mL via INTRAVENOUS

## 2024-03-05 MED ORDER — POTASSIUM CHLORIDE 10 MEQ/100ML IV SOLN
10.0000 meq | INTRAVENOUS | Status: AC
  Administered 2024-03-05 (×4): 10 meq via INTRAVENOUS
  Filled 2024-03-05 (×4): qty 100

## 2024-03-05 MED ORDER — ENOXAPARIN SODIUM 40 MG/0.4ML IJ SOSY
40.0000 mg | PREFILLED_SYRINGE | INTRAMUSCULAR | Status: DC
  Administered 2024-03-06 – 2024-03-07 (×2): 40 mg via SUBCUTANEOUS
  Filled 2024-03-05 (×2): qty 0.4

## 2024-03-05 MED ORDER — IPRATROPIUM BROMIDE 0.02 % IN SOLN
0.5000 mg | Freq: Four times a day (QID) | RESPIRATORY_TRACT | Status: DC
  Filled 2024-03-05: qty 2.5

## 2024-03-05 MED ORDER — ONDANSETRON HCL 4 MG/2ML IJ SOLN
4.0000 mg | Freq: Four times a day (QID) | INTRAMUSCULAR | Status: DC | PRN
Start: 2024-03-05 — End: 2024-03-07

## 2024-03-05 MED ORDER — PHENOL 1.4 % MT LIQD: 1.0000 | OROMUCOSAL | Status: DC | PRN

## 2024-03-05 MED ORDER — ACETYLCYSTEINE 20 % IN SOLN
3.0000 mL | Freq: Two times a day (BID) | RESPIRATORY_TRACT | Status: DC
Start: 2024-03-05 — End: 2024-03-05
  Filled 2024-03-05: qty 4

## 2024-03-05 MED ORDER — METHYLPREDNISOLONE SODIUM SUCC 40 MG IJ SOLR
40.0000 mg | Freq: Two times a day (BID) | INTRAMUSCULAR | Status: DC
  Administered 2024-03-05 – 2024-03-07 (×4): 40 mg via INTRAVENOUS
  Filled 2024-03-05 (×4): qty 1

## 2024-03-05 MED ORDER — ALBUTEROL (5 MG/ML) CONTINUOUS INHALATION SOLN: 10.0000 mg/h | INHALATION_SOLUTION | Freq: Once | RESPIRATORY_TRACT | Status: DC

## 2024-03-05 MED ORDER — LEVOTHYROXINE SODIUM 50 MCG PO TABS
50.0000 ug | ORAL_TABLET | Freq: Every day | ORAL | Status: DC
  Administered 2024-03-05 – 2024-03-07 (×3): 50 ug via ORAL
  Filled 2024-03-05: qty 2
  Filled 2024-03-05 (×2): qty 1

## 2024-03-05 MED ORDER — LEVALBUTEROL HCL 0.63 MG/3ML IN NEBU
0.6300 mg | INHALATION_SOLUTION | Freq: Four times a day (QID) | RESPIRATORY_TRACT | Status: DC
  Administered 2024-03-05 – 2024-03-06 (×7): 0.63 mg via RESPIRATORY_TRACT
  Filled 2024-03-05 (×7): qty 3

## 2024-03-05 MED ORDER — AZITHROMYCIN 250 MG PO TABS
500.0000 mg | ORAL_TABLET | Freq: Every day | ORAL | Status: DC
  Administered 2024-03-06 – 2024-03-07 (×2): 500 mg via ORAL
  Filled 2024-03-05 (×2): qty 2

## 2024-03-05 MED ORDER — GUAIFENESIN-DM 100-10 MG/5ML PO SYRP
5.0000 mL | ORAL_SOLUTION | ORAL | Status: DC | PRN
  Administered 2024-03-05 – 2024-03-07 (×5): 5 mL via ORAL
  Filled 2024-03-05 (×5): qty 5

## 2024-03-05 MED ORDER — IPRATROPIUM BROMIDE 0.02 % IN SOLN: 1.5000 mg | Freq: Once | RESPIRATORY_TRACT | Status: DC

## 2024-03-05 MED ORDER — LEVALBUTEROL HCL 0.63 MG/3ML IN NEBU
0.6300 mg | INHALATION_SOLUTION | Freq: Four times a day (QID) | RESPIRATORY_TRACT | Status: DC
  Filled 2024-03-05: qty 3

## 2024-03-05 MED ORDER — MENTHOL 3 MG MT LOZG
1.0000 | LOZENGE | OROMUCOSAL | Status: DC | PRN
  Administered 2024-03-07: 3 mg via ORAL
  Filled 2024-03-05 (×2): qty 9

## 2024-03-05 MED ORDER — GUAIFENESIN ER 600 MG PO TB12
600.0000 mg | ORAL_TABLET | Freq: Two times a day (BID) | ORAL | Status: DC
  Administered 2024-03-05 – 2024-03-07 (×5): 600 mg via ORAL
  Filled 2024-03-05 (×5): qty 1

## 2024-03-05 MED ORDER — POTASSIUM CHLORIDE CRYS ER 20 MEQ PO TBCR: 80.0000 meq | EXTENDED_RELEASE_TABLET | Freq: Once | ORAL | Status: DC

## 2024-03-05 MED ORDER — ENOXAPARIN SODIUM 40 MG/0.4ML IJ SOSY
40.0000 mg | PREFILLED_SYRINGE | INTRAMUSCULAR | Status: DC
Start: 2024-03-05 — End: 2024-03-05

## 2024-03-05 MED ORDER — IPRATROPIUM BROMIDE 0.02 % IN SOLN
0.5000 mg | Freq: Four times a day (QID) | RESPIRATORY_TRACT | Status: DC
  Administered 2024-03-05 – 2024-03-06 (×7): 0.5 mg via RESPIRATORY_TRACT
  Filled 2024-03-05 (×7): qty 2.5

## 2024-03-05 MED ORDER — HYDROCHLOROTHIAZIDE 25 MG PO TABS
25.0000 mg | ORAL_TABLET | Freq: Every day | ORAL | Status: DC
  Administered 2024-03-05: 25 mg via ORAL
  Filled 2024-03-05: qty 1

## 2024-03-05 MED ORDER — METHYLPREDNISOLONE SODIUM SUCC 125 MG IJ SOLR
125.0000 mg | Freq: Once | INTRAMUSCULAR | Status: AC
  Administered 2024-03-05: 125 mg via INTRAVENOUS
  Filled 2024-03-05: qty 2

## 2024-03-05 MED ORDER — LABETALOL HCL 5 MG/ML IV SOLN
10.0000 mg | Freq: Once | INTRAVENOUS | Status: AC
Start: 2024-03-05 — End: 2024-03-05
  Administered 2024-03-05: 10 mg via INTRAVENOUS
  Filled 2024-03-05: qty 4

## 2024-03-05 MED ORDER — SODIUM CHLORIDE 0.9% FLUSH: 3.0000 mL | INTRAVENOUS | Status: DC | PRN

## 2024-03-05 MED ORDER — INSULIN ASPART 100 UNIT/ML IJ SOLN
0.0000 [IU] | Freq: Every day | INTRAMUSCULAR | Status: DC
  Administered 2024-03-05 – 2024-03-06 (×2): 2 [IU] via SUBCUTANEOUS
  Filled 2024-03-05: qty 0.05

## 2024-03-05 MED ORDER — SODIUM CHLORIDE 0.9 % IV SOLN
250.0000 mL | INTRAVENOUS | Status: AC | PRN
  Administered 2024-03-05: 250 mL via INTRAVENOUS

## 2024-03-05 MED ORDER — HYDROCHLOROTHIAZIDE 25 MG PO TABS: 25.0000 mg | ORAL_TABLET | Freq: Every day | ORAL | Status: DC

## 2024-03-05 MED ORDER — IPRATROPIUM-ALBUTEROL 0.5-2.5 (3) MG/3ML IN SOLN: 3.0000 mL | Freq: Once | RESPIRATORY_TRACT | Status: DC

## 2024-03-05 MED ORDER — HYDRALAZINE HCL 10 MG PO TABS: 10.0000 mg | ORAL_TABLET | Freq: Four times a day (QID) | ORAL | Status: DC | PRN

## 2024-03-05 MED ORDER — SODIUM CHLORIDE 0.9 % IV SOLN
1.0000 g | Freq: Once | INTRAVENOUS | Status: AC
  Administered 2024-03-05: 1 g via INTRAVENOUS
  Filled 2024-03-05: qty 10

## 2024-03-05 MED ORDER — LOSARTAN POTASSIUM 50 MG PO TABS
50.0000 mg | ORAL_TABLET | Freq: Every day | ORAL | Status: DC
  Administered 2024-03-05 – 2024-03-07 (×3): 50 mg via ORAL
  Filled 2024-03-05 (×3): qty 1

## 2024-03-05 MED ORDER — ACETYLCYSTEINE 20 % IN SOLN
3.0000 mL | Freq: Two times a day (BID) | RESPIRATORY_TRACT | Status: DC
  Administered 2024-03-05 – 2024-03-06 (×3): 3 mL via RESPIRATORY_TRACT
  Filled 2024-03-05 (×4): qty 4

## 2024-03-05 MED ORDER — MAGNESIUM SULFATE 2 GM/50ML IV SOLN
2.0000 g | Freq: Once | INTRAVENOUS | Status: AC
Start: 2024-03-05 — End: 2024-03-05
  Administered 2024-03-05: 2 g via INTRAVENOUS
  Filled 2024-03-05: qty 50

## 2024-03-05 NOTE — Progress Notes (Addendum)
  Hyponatremia Hypokalemia -CMP showing low sodium 132 and low potassium 2.5 - Replating with KCl 80 mEq.  Continue to monitor serum sodium level. Measuring serum osmolarity, urine osmolarity and urine sodium.

## 2024-03-05 NOTE — ED Triage Notes (Signed)
 Pt states that she was diagnosed with RSV the other day, has been SOB for the past two days, tonight it got worse, audible wheezing, speaking in short sentences, accessory muscle use, dry cough

## 2024-03-05 NOTE — ED Notes (Signed)
 A hat was put in the toilet but pt may have dumped the urine in the toilet

## 2024-03-05 NOTE — ED Provider Notes (Signed)
 [place] EMERGENCY DEPARTMENT AT [facility] Provider Note   CSN: [id number] Arrival date & time: 03/05/24  0301     History  Chief Complaint  Patient presents with   Shortness of Breath    Mary Hayes is a 83 y.o. female history of hypertension, hypothyroidism presented for shortness of breath.  Patient is been short of breath for the past 2 days as she was diagnosed with RSV when she was in [place].  Patient has no history of blood clots or leg swelling however states that the shortness of breath got worse earlier this evening.  Patient is only able to speak in few sentences and so son was present to provide the rest of the history.  Patient is not had any fevers.  Patient has had a dry cough and has had some chest discomfort.  Patient has no respiratory history.  Patient does not smoke.   Home Medications Prior to Admission medications   Medication Sig Start Date End Date Taking? Authorizing Provider  acetaminophen (TYLENOL) 650 MG CR tablet Take 650 mg by mouth every 8 (eight) hours as needed for pain.    [provider]  atorvastatin (LIPITOR) 10 MG tablet Take 10 mg by mouth daily.    [provider]  cholecalciferol (VITAMIN D3) 25 MCG (1000 UT) tablet Take 1,000 Units by mouth daily.    [provider]  hydrochlorothiazide (HYDRODIURIL) 25 MG tablet Take 1 tablet (25 mg total) by mouth daily. 02/08/21   Charlynne Pander, MD  hydrocortisone cream 1 % Apply 1 application topically 3 (three) times daily as needed for itching.    [provider]  levothyroxine (SYNTHROID, LEVOTHROID) 25 MCG tablet Take 25 mcg by mouth daily. 12/24/18   [provider]  losartan (COZAAR) 50 MG tablet Take 50 mg by mouth daily.    [provider]  methocarbamol (ROBAXIN) 500 MG tablet Take 1 tablet (500 mg total) by mouth every 6 (six) hours as needed for muscle spasms. 01/13/19   Edmisten, Lyn Hollingshead, PA  Multiple Vitamin  (MULTIVITAMIN WITH MINERALS) TABS tablet Take 1 tablet by mouth daily.    [provider]  naproxen (NAPROSYN) 375 MG tablet Take 375 mg by mouth 2 (two) times daily as needed for pain. 01/26/21   [provider]  oxyCODONE (OXY IR/ROXICODONE) 5 MG immediate release tablet Take 1-2 tablets (5-10 mg total) by mouth every 6 (six) hours as needed for moderate pain or severe pain. 01/13/19   Edmisten, Lyn Hollingshead, PA  polyvinyl alcohol (LIQUIFILM TEARS) 1.4 % ophthalmic solution Place 1 drop into both eyes as needed for dry eyes.    [provider]  predniSONE (DELTASONE) 10 MG tablet Take 4 tablets (40 mg total) by mouth daily. 06/18/22   Vanetta Mulders, MD  psyllium (HYDROCIL/METAMUCIL) 95 % PACK Take 1 packet by mouth daily.    [provider]  raloxifene (EVISTA) 60 MG tablet Take 60 mg by mouth daily. 11/24/18   [provider]  sodium chloride (OCEAN) 0.65 % SOLN nasal spray Place 1 spray into both nostrils as needed for congestion.    [provider]  traMADol (ULTRAM) 50 MG tablet Take 1-2 tablets (50-100 mg total) by mouth every 6 (six) hours as needed for moderate pain (if oxycodone alone insufficient). 01/13/19   Edmisten, Lyn Hollingshead, PA  traMADol (ULTRAM) 50 MG tablet Take 1 tablet (50 mg total) by mouth every 6 (six) hours as needed. 06/18/22  Vanetta Mulders, MD      Allergies    Lisinopril    Review of Systems   Review of Systems  Respiratory:  Positive for shortness of breath.     Physical Exam Updated Vital Signs BP (!) 165/63   Pulse (!) 126   Temp 99.2 F (37.3 C) (Oral)   Resp 19   SpO2 93%  Physical Exam Constitutional:      General: She is in acute distress.  Cardiovascular:     Rate and Rhythm: Regular rhythm. Tachycardia present.     Pulses: Normal pulses.     Heart sounds: Normal heart sounds.  Pulmonary:     Effort: Tachypnea, accessory muscle usage and respiratory distress present.     Breath sounds: Wheezing  present.     Comments: Only able to speak in 1-2 word sentences Musculoskeletal:     Right lower leg: No tenderness. No edema.     Left lower leg: No tenderness. No edema.  Skin:    General: Skin is warm and dry.  Neurological:     Mental Status: She is alert.  Psychiatric:        Mood and Affect: Mood normal.     ED Results / Procedures / Treatments   Labs (all labs ordered are listed, but only abnormal results are displayed) Labs Reviewed  RESP PANEL BY RT-PCR (RSV, FLU A&B, COVID)  RVPGX2 - Abnormal; Notable for the following components:      Result Value   Resp Syncytial Virus by PCR POSITIVE (*)    All other components within normal limits  CBC WITH DIFFERENTIAL/PLATELET - Abnormal; Notable for the following components:   Monocytes Absolute 1.2 (*)    All other components within normal limits  CULTURE, BLOOD (ROUTINE X 2)  CULTURE, BLOOD (ROUTINE X 2)  EXPECTORATED SPUTUM ASSESSMENT W GRAM STAIN, RFLX TO RESP C  COMPREHENSIVE METABOLIC PANEL  BRAIN NATRIURETIC PEPTIDE  LEGIONELLA PNEUMOPHILA SEROGP 1 UR AG  STREP PNEUMONIAE URINARY ANTIGEN  CBC  COMPREHENSIVE METABOLIC PANEL  PROCALCITONIN  HEMOGLOBIN A1C  TROPONIN I (HIGH SENSITIVITY)  TROPONIN I (HIGH SENSITIVITY)    EKG None  Radiology DG Chest Port 1 View Result Date: 03/05/2024 CLINICAL DATA:  Shortness of breath EXAM: PORTABLE CHEST 1 VIEW COMPARISON:  02/20/2021 FINDINGS: Limited by kyphotic positioning. The chin overlaps the medial apices. Low volume chest with diffuse interstitial coarsening and hazy density at the bases. No Kerley lines, effusion, or pneumothorax. Stable heart size and mediastinal contours. IMPRESSION: Low volume chest with atelectasis or infiltrate over the lower lungs, accentuated by kyphotic positioning. Electronically Signed   By: Tiburcio Pea M.D.   On: 03/05/2024 04:38    Procedures .Critical Care  Performed by: Netta Corrigan, PA-C Authorized by: Netta Corrigan, PA-C    Critical care provider statement:    Critical care time (minutes):  30   Critical care time was exclusive of:  Separately billable procedures and treating other patients   Critical care was necessary to treat or prevent imminent or life-threatening deterioration of the following conditions:  Respiratory failure   Critical care was time spent personally by me on the following activities:  Blood draw for specimens, development of treatment plan with patient or surrogate, discussions with consultants, evaluation of patient's response to treatment, examination of patient, obtaining history from patient or surrogate, review of old charts, re-evaluation of patient's condition, pulse oximetry, ordering and review of radiographic studies, ordering and review of laboratory studies and ordering  and performing treatments and interventions   I assumed direction of critical care for this patient from another provider in my specialty: no     Care discussed with: admitting provider       Medications Ordered in ED Medications  cefTRIAXone (ROCEPHIN) 1 g in sodium chloride 0.9 % 100 mL IVPB (1 g Intravenous New Bag/Given 03/05/24 0453)  azithromycin (ZITHROMAX) 500 mg in sodium chloride 0.9 % 250 mL IVPB (500 mg Intravenous New Bag/Given 03/05/24 0453)  atorvastatin (LIPITOR) tablet 10 mg (has no administration in time range)  hydrochlorothiazide (HYDRODIURIL) tablet 25 mg (has no administration in time range)  losartan (COZAAR) tablet 50 mg (has no administration in time range)  levothyroxine (SYNTHROID) tablet 25 mcg (has no administration in time range)  levalbuterol (XOPENEX) nebulizer solution 0.63 mg (has no administration in time range)  ipratropium (ATROVENT) nebulizer solution 0.5 mg (has no administration in time range)  methylPREDNISolone sodium succinate (SOLU-MEDROL) 40 mg/mL injection 40 mg (has no administration in time range)  pantoprazole (PROTONIX) EC tablet 40 mg (has no administration in time  range)  cefTRIAXone (ROCEPHIN) 2 g in sodium chloride 0.9 % 100 mL IVPB (has no administration in time range)  azithromycin (ZITHROMAX) 500 mg in sodium chloride 0.9 % 250 mL IVPB (has no administration in time range)  enoxaparin (LOVENOX) injection 40 mg (has no administration in time range)  sodium chloride flush (NS) 0.9 % injection 3 mL (has no administration in time range)  acetylcysteine (MUCOMYST) 20 % nebulizer / oral solution 3 mL (has no administration in time range)  guaiFENesin (MUCINEX) 12 hr tablet 600 mg (has no administration in time range)  sodium chloride flush (NS) 0.9 % injection 3 mL (has no administration in time range)  sodium chloride flush (NS) 0.9 % injection 3 mL (has no administration in time range)  0.9 %  sodium chloride infusion (has no administration in time range)  acetaminophen (TYLENOL) tablet 650 mg (has no administration in time range)    Or  acetaminophen (TYLENOL) suppository 650 mg (has no administration in time range)  ondansetron (ZOFRAN) tablet 4 mg (has no administration in time range)    Or  ondansetron (ZOFRAN) injection 4 mg (has no administration in time range)  insulin aspart (novoLOG) injection 0-6 Units (has no administration in time range)  insulin aspart (novoLOG) injection 0-5 Units (has no administration in time range)  methylPREDNISolone sodium succinate (SOLU-MEDROL) 125 mg/2 mL injection 125 mg (125 mg Intravenous Given 03/05/24 0319)  ipratropium-albuterol (DUONEB) 0.5-2.5 (3) MG/3ML nebulizer solution 9 mL (9 mLs Nebulization Given 03/05/24 0325)  magnesium sulfate IVPB 2 g 50 mL (0 g Intravenous Stopped 03/05/24 0428)  sodium chloride 0.9 % bolus 500 mL (0 mLs Intravenous Stopped 03/05/24 0358)  ipratropium-albuterol (DUONEB) 0.5-2.5 (3) MG/3ML nebulizer solution 3 mL (3 mLs Nebulization Given 03/05/24 0429)    ED Course/ Medical Decision Making/ A&P                                 Medical Decision Making Amount and/or Complexity  of Data Reviewed Labs: ordered. Radiology: ordered.  Risk Prescription drug management.   Mary Hayes 83 y.o. presented today for shortness of breath.  Working DDx that I considered at this time includes, but not limited to, asthma/COPD exacerbation, URI, viral illness, anemia, ACS, PE, pneumonia, pleural effusion, lung cancer, CHF, respiratory distress, medication side effect, intoxication.  R/o DDx: asthma/COPD exacerbation,  URI,, anemia, ACS, PE, pneumonia, pleural effusion, lung cancer, CHF, respiratory distress, medication side effect, intoxication: These are considered less likely due to history of present illness, physical exam, labs/imaging findings  Review of prior external notes: 08/27/2023 office visit  Unique Tests and My Independent Interpretation:  CBC: Unremarkable BMP: Pending EKG: Sinus 118 bpm, PACs present, no signs of ST elevation or depression, no signs of right heart strain Troponin: 9 CXR: Infiltrates noted Respiratory Panel: RSV positive  Social Determinants of Health: none  Discussion with Independent Historian:  Son  Discussion of Management of Tests:  Sundil, MD Hospitalist  Risk: High: hospitalization or escalation of hospital-level care  Risk Stratification Score: None  Plan: On exam patient was in respiratory distress. Physical exam showed accessory muscle usage and not being able to speak in full sentences that she can only speak in 1-2 word sentences.  Exam also does show expiratory wheezing bilaterally. The cardiac monitor was ordered secondary to the patient's history of shortness of breath and to monitor the patient for dysrhythmia. Cardiac monitor by my independent interpretation showed sinus tachycardia.  Breathing treatments along with steroids and mag were ordered.  Labs and imaging ordered.  Patient did test positive for RSV.  Patient is now requiring 2 L nasal cannula as she did become hypoxic at 88% on room air.  Patient's labs are  reassuring however BNP hemolyzed and so we will try to redraw this.  Chest x-ray shows infiltrate so we will add on antibiotics however do suspect this is RSV pneumonia.  Given patient's new oxygen requirement and RSV will admit to hospitalist.  I spoke to the hospitalist and patient was accepted for admission.  Patient stable to be admitted.  This chart was dictated using voice recognition software.  Despite best efforts to proofread,  errors can occur which can change the documentation meaning.         Final Clinical Impression(s) / ED Diagnoses Final diagnoses:  RSV (respiratory syncytial virus infection)  Acute respiratory failure with hypoxia [facility])    Rx / DC Orders ED Discharge Orders     None         Netta Corrigan, PA-C 03/05/24 0504    Coral Spikes, DO 03/05/24 [phone]

## 2024-03-05 NOTE — Progress Notes (Signed)
   03/05/24 1939  BiPAP/CPAP/SIPAP  BiPAP/CPAP/SIPAP Pt Type Adult  Reason BIPAP/CPAP not in use Non-compliant

## 2024-03-05 NOTE — H&P (Signed)
 History and Physical    Mary Hayes:[id number] DOB: 11-07-1941 DOA: 03/05/2024  PCP: [facility] [place]   Patient coming from: Home   Chief Complaint:  Chief Complaint  Patient presents with   Shortness of Breath   ED TRIAGE note:  Pt states that she was diagnosed with RSV the other day, has been SOB for the past two days, tonight it got worse, audible wheezing, speaking in short sentences, accessory muscle use, dry cough       HPI:  Mary Hayes is a 83 y.o. female with medical history significant of reactive airway disease, non-insulin-dependent DM type II, osteoarthritis, obstructive sleep apnea, degenerative lumbosacral disease, essential hypertension, hyperlipidemia, hypothyroidism and chronic pain syndrome presented to emergency department complaining of shortness of breath.  Patient reports that she has been diagnosed with RSV 2 days ago states that she has been having worsening shortness of breath and currently unable to complete a full sentence without catching breath.  Patient also complaining about dry cough and chest discomfort.  Denies any fever and chill.  Patient denies any history of long distance travel/driving/flight.  No personal history of cancer.   Denies any smoking history.   ED Course:  At presentation to ED patient is tachycardic heart rate 126, tachypneic 26, hypertensive blood pressure 165/63 and O2 sat 93 to 94% on 2L.  Respiratory panel positive with RSV. Troponin level 3. CBC unremarkable. Checking CMP and BNP level.  Checking EKG. Chest x-ray showing low lung volumes with atelectasis and infiltrate over the lower lung field attended by kyphotic positioning.  In the ED patient has been treated with DuoNeb nebulizer, methylprednisolone, ceftriaxone, azithromycin and IV Solu-Medrol.  ED physician reported that in the ED O2 sat dropped to 88% on room air.  Hospitalist has been consulted for further evaluation  management of acute hypoxic respiratory failure, reactive airway disease exacerbation in the context of pneumonia and RSV infection.  Significant labs in the ED: Lab Orders         Resp panel by RT-PCR (RSV, Flu A&B, Covid) Anterior Nasal Swab         Culture, blood (routine x 2) Call MD if unable to obtain prior to antibiotics being given         Expectorated Sputum Assessment w Gram Stain, Rflx to Resp Cult         CBC with Differential         Comprehensive metabolic panel         Brain natriuretic peptide         Legionella Pneumophila Serogp 1 Ur Ag         Strep pneumoniae urinary antigen         CBC         Comprehensive metabolic panel         Procalcitonin         Hemoglobin A1c       Review of Systems:  Review of Systems  Constitutional:  Negative for chills, fever, malaise/fatigue and weight loss.  Respiratory:  Positive for cough, sputum production, shortness of breath and wheezing. Negative for hemoptysis.   Cardiovascular:  Negative for chest pain, palpitations and leg swelling.  Gastrointestinal:  Negative for abdominal pain, heartburn, nausea and vomiting.  Musculoskeletal:  Negative for back pain, joint pain, myalgias and neck pain.  Neurological:  Negative for dizziness and headaches.  Psychiatric/Behavioral:  The patient is not nervous/anxious.     Past Medical History:  Diagnosis Date   Arthritis    Hypercholesteremia    Hypertension    Hypothyroidism    Sleep apnea    cpap    Past Surgical History:  Procedure Laterality Date   HAND SURGERY     right hand pinched nerve   JOINT REPLACEMENT Right    Knee   TONSILLECTOMY     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   Procedure: TOTAL KNEE ARTHROPLASTY;  Surgeon: Ollen Gross, MD;  Location: [facility];  Service: Orthopedics;  Laterality: Left;      reports that she has never smoked. She has never used smokeless tobacco. She reports that she does not drink alcohol and does not use  drugs.  Allergies  Allergen Reactions   Lisinopril Other (See Comments)    imflamation of lips    History reviewed. No pertinent family history.  Prior to Admission medications   Medication Sig Start Date End Date Taking? Authorizing Provider  acetaminophen (TYLENOL) 650 MG CR tablet Take 650 mg by mouth every 8 (eight) hours as needed for pain.    [provider]  atorvastatin (LIPITOR) 10 MG tablet Take 10 mg by mouth daily.    [provider]  cholecalciferol (VITAMIN D3) 25 MCG (1000 UT) tablet Take 1,000 Units by mouth daily.    [provider]  hydrochlorothiazide (HYDRODIURIL) 25 MG tablet Take 1 tablet (25 mg total) by mouth daily. 02/08/21   Charlynne Pander, MD  hydrocortisone cream 1 % Apply 1 application topically 3 (three) times daily as needed for itching.    [provider]  levothyroxine (SYNTHROID, LEVOTHROID) 25 MCG tablet Take 25 mcg by mouth daily. 12/24/18   [provider]  losartan (COZAAR) 50 MG tablet Take 50 mg by mouth daily.    [provider]  methocarbamol (ROBAXIN) 500 MG tablet Take 1 tablet (500 mg total) by mouth every 6 (six) hours as needed for muscle spasms. 01/13/19   Edmisten, Lyn Hollingshead, PA  Multiple Vitamin (MULTIVITAMIN WITH MINERALS) TABS tablet Take 1 tablet by mouth daily.    [provider]  naproxen (NAPROSYN) 375 MG tablet Take 375 mg by mouth 2 (two) times daily as needed for pain. 01/26/21   [provider]  oxyCODONE (OXY IR/ROXICODONE) 5 MG immediate release tablet Take 1-2 tablets (5-10 mg total) by mouth every 6 (six) hours as needed for moderate pain or severe pain. 01/13/19   Edmisten, Lyn Hollingshead, PA  polyvinyl alcohol (LIQUIFILM TEARS) 1.4 % ophthalmic solution Place 1 drop into both eyes as needed for dry eyes.    [provider]  predniSONE (DELTASONE) 10 MG tablet Take 4 tablets (40 mg total) by mouth daily. 06/18/22   Vanetta Mulders, MD  psyllium  (HYDROCIL/METAMUCIL) 95 % PACK Take 1 packet by mouth daily.    [provider]  raloxifene (EVISTA) 60 MG tablet Take 60 mg by mouth daily. 11/24/18   [provider]  sodium chloride (OCEAN) 0.65 % SOLN nasal spray Place 1 spray into both nostrils as needed for congestion.    [provider]  traMADol (ULTRAM) 50 MG tablet Take 1-2 tablets (50-100 mg total) by mouth every 6 (six) hours as needed for moderate pain (if oxycodone alone insufficient). 01/13/19   Edmisten, Lyn Hollingshead, PA  traMADol (ULTRAM) 50 MG tablet Take 1 tablet (50 mg total) by mouth every 6 (six) hours as needed. 06/18/22   Vanetta Mulders, MD     Physical Exam: Vitals:  03/05/24 0340 03/05/24 0400 03/05/24 0500 03/05/24 0522  BP:  (!) 165/63 (!) 199/82   Pulse: (!) 115 (!) 126 92   Resp: 20 19 (!) 22   Temp:      TempSrc:      SpO2: 94% 93% 95%   Weight:    89.8 kg  Height:    5\' 6"  (1.676 m)    Physical Exam Constitutional:      Appearance: She is ill-appearing.  Cardiovascular:     Rate and Rhythm: Regular rhythm. Tachycardia present.  Pulmonary:     Effort: Tachypnea present. No accessory muscle usage or respiratory distress.     Breath sounds: Examination of the right-upper field reveals decreased breath sounds. Examination of the left-upper field reveals decreased breath sounds. Examination of the right-middle field reveals wheezing. Examination of the left-middle field reveals wheezing. Examination of the right-lower field reveals wheezing. Examination of the left-lower field reveals wheezing. Decreased breath sounds and wheezing present. No rhonchi or rales.  Musculoskeletal:     Cervical back: Neck supple.     Right lower leg: No edema.     Left lower leg: No edema.  Skin:    General: Skin is warm.     Capillary Refill: Capillary refill takes less than 2 seconds.  Neurological:     General: No focal deficit present.     Mental Status: She is alert and oriented to person,  place, and time.      Labs on Admission: I have personally reviewed following labs and imaging studies  CBC: Recent Labs  Lab 03/05/24 0318  WBC 8.4  NEUTROABS 4.8  HGB 12.7  HCT 39.1  MCV 87.5  PLT 240   Basic Metabolic Panel: No results for input(s): "NA", "K", "CL", "CO2", "GLUCOSE", "BUN", "CREATININE", "CALCIUM", "MG", "PHOS" in the last 168 hours. GFR: CrCl cannot be calculated (Patient's most recent lab result is older than the maximum 21 days allowed.). Liver Function Tests: No results for input(s): "AST", "ALT", "ALKPHOS", "BILITOT", "PROT", "ALBUMIN" in the last 168 hours. No results for input(s): "LIPASE", "AMYLASE" in the last 168 hours. No results for input(s): "AMMONIA" in the last 168 hours. Coagulation Profile: No results for input(s): "INR", "PROTIME" in the last 168 hours. Cardiac Enzymes: Recent Labs  Lab 03/05/24 0318  TROPONINIHS 9   BNP (last 3 results) No results for input(s): "BNP" in the last 8760 hours. HbA1C: No results for input(s): "HGBA1C" in the last 72 hours. CBG: No results for input(s): "GLUCAP" in the last 168 hours. Lipid Profile: No results for input(s): "CHOL", "HDL", "LDLCALC", "TRIG", "CHOLHDL", "LDLDIRECT" in the last 72 hours. Thyroid Function Tests: No results for input(s): "TSH", "T4TOTAL", "FREET4", "T3FREE", "THYROIDAB" in the last 72 hours. Anemia Panel: No results for input(s): "VITAMINB12", "FOLATE", "FERRITIN", "TIBC", "IRON", "RETICCTPCT" in the last 72 hours. Urine analysis:    Component Value Date/Time   COLORURINE YELLOW 02/21/2010 0920   APPEARANCEUR TURBID (A) 02/21/2010 0920   LABSPEC 1.019 02/21/2010 0920   PHURINE 6.5 02/21/2010 0920   GLUCOSEU NEGATIVE 02/21/2010 0920   HGBUR MODERATE (A) 02/21/2010 0920   BILIRUBINUR NEGATIVE 02/21/2010 0920   KETONESUR NEGATIVE 02/21/2010 0920   PROTEINUR NEGATIVE 02/21/2010 0920   UROBILINOGEN 0.2 02/21/2010 0920   NITRITE NEGATIVE 02/21/2010 0920   LEUKOCYTESUR  NEGATIVE 02/21/2010 0920    Radiological Exams on Admission: I have personally reviewed images DG Chest Port 1 View Result Date: 03/05/2024 CLINICAL DATA:  Shortness of breath EXAM: PORTABLE CHEST 1 VIEW  COMPARISON:  02/20/2021 FINDINGS: Limited by kyphotic positioning. The chin overlaps the medial apices. Low volume chest with diffuse interstitial coarsening and hazy density at the bases. No Kerley lines, effusion, or pneumothorax. Stable heart size and mediastinal contours. IMPRESSION: Low volume chest with atelectasis or infiltrate over the lower lungs, accentuated by kyphotic positioning. Electronically Signed   By: Tiburcio Pea M.D.   On: 03/05/2024 04:38     EKG: My personal interpretation of EKG shows: EKG showing sinus tachycardia.    Assessment/Plan: Principal Problem:   Acute hypoxic respiratory failure (HCC) Active Problems:   RSV infection   CAP (community acquired pneumonia)   Reactive airway disease with acute exacerbation   Non-insulin dependent type 2 diabetes mellitus (HCC)   Chronic pain syndrome   Obstructive sleep apnea   Essential hypertension   Hyperlipidemia   Hypothyroidism    Assessment and Plan: Acute hypoxic respiratory failure-secondary to RSV infection and community-acquired pneumonia Reactive airway disease exacerbation RSV infection Community-acquired pneumonia -Patient presenting with complaining of shortness of breath, cough, unable to complete a full sentence due to shortness of breath which has been started after patient found to have RSV infection 2 days ago. - At presentation to ED patient O2 sat dropped to 87 to 88% room air.  Physical exam showing significant wheezing bilateral upper lower lung field.-Currently maintaining 93 to 94% on 2 L oxygen.  Patient tachycardic, tachypneic and borderline hypertensive.  CBC no evidence of leukocytosis.  Respiratory panel positive with RSV.  Pending CMP and BNP level. - Chest x-ray showing evidence of  infiltrate or pneumonia. - In the ED patient has been treated with 500 mL of NS bolus, IV Solu-Medrol 125 mg, DuoNeb nebulizer x 2, ceftriaxone and azithromycin. - Continue IV Solu-Medrol 40 mg twice daily, Xopenex every 6 hours scheduled, ipratropium every 6 hour scheduled, Mucomyst twice daily, and pulmonary toiletry.  Continue supportive care.  Continue pulse ox and supplemental oxygen to keep O2 sat above 94%. -For the management of pneumonia checking blood culture, sputum culture, urine Legionella antigen and urine strep antigen and procalcitonin level.  Continue IV ceftriaxone and IV azithromycin. Will follow-up with culture results for antibiotic guidance. -Continue cardiac monitoring.  Non-insulin-dependent DM type II -At home patient is not any antidiabetic agent.  Given patient has history of diabetes and currently on IV Solu-Medrol continue to check POC blood glucose and SSI and at bedtime insulin with mealtime.  Obstructive sleep apnea -Continue check pulse ox and supplemental oxygen at bedtime.  Chronic pain syndrome -Verify with pharmacy before reinitiating oral pain medications.  Also need to hold pressure medication in the setting of acute hypoxic respiratory failure  Essential hypertension -Continue losartan and hydrochlorothiazide.   Hypothyroidism -Continue levothyroxine  DVT prophylaxis:  Lovenox Code Status:  Full Code Diet: Heart healthy carb modified diet Family Communication:   Family was present at bedside, at the time of interview. Opportunity was given to ask question and all questions were answered satisfactorily.  Disposition Plan: Continue monitor improvement of wheezing or shortness of breath.  Pending blood culture, sputum culture and procalcitonin level. Consults: Respiratory care Admission status:   Inpatient, Telemetry bed  Severity of Illness: The appropriate patient status for this patient is INPATIENT. Inpatient status is judged to be reasonable  and necessary in order to provide the required intensity of service to ensure the patient's safety. The patient's presenting symptoms, physical exam findings, and initial radiographic and laboratory data in the context of their chronic comorbidities is felt  to place them at high risk for further clinical deterioration. Furthermore, it is not anticipated that the patient will be medically stable for discharge from the hospital within 2 midnights of admission.   * I certify that at the point of admission it is my clinical judgment that the patient will require inpatient hospital care spanning beyond 2 midnights from the point of admission due to high intensity of service, high risk for further deterioration and high frequency of surveillance required.Marland Kitchen    Tereasa Coop, MD [facility]  How to contact the [facility] Attending or Consulting provider 7A - 7P or covering provider during after hours 7P -7A, for this patient.  Check the care team in [facility] and look for a) attending/consulting [facility] provider listed and b) the [facility] team listed Log into [email or web address] and use 's universal password to access. If you do not have the password, please contact the hospital operator. Locate the [facility] provider you are looking for under [facility] and page to a number that you can be directly reached. If you still have difficulty reaching the provider, please page the [facility] (Director on Call) for the Hospitalists listed on amion for assistance.  03/05/2024, 5:24 AM

## 2024-03-05 NOTE — Progress Notes (Addendum)
 PROGRESS NOTE    LUCILA KLECKA  GNF:[id number] DOB: 18-Sep-1941 DOA: 03/05/2024 PCP: [facility] [place]    Brief Narrative:   Mary Hayes is a 83 y.o. female with past medical history significant for HTN, HLD, hypothyroidism, DM2, OSA, osteoarthritis, degenerative lumbar sacral disease, reactive airway disease, chronic pain syndrome who presented to [facility] ED on 03/05/2024 with progressive shortness of breath over the last 2 days associated with nonproductive cough.  Reports recently diagnosed with RSV few days prior; and since has had progressive symptoms.  Unable to complete a full sentence without catching her breath.  Denies fever, no chills.  Denies tobacco use.  In the ED, temperature 99.2 F, HR 125, RR 28, BP 160/75, SpO2 93% on 4 L nasal cannula.  WBC 8.4, hemoglobin 12.7, platelet count 240.  Sodium 132, potassium 2.5, chloride 96, CO2 23, glucose 200, BUN 11, creatinine 0.62.  AST 38, ALT 32, total bilirubin 0.7.  High sensitive troponin 9>57.  BNP 68.8.  COVID/influenza PCR negative.  RSV PCR positive.  Chest x-ray with low volume chest with atelectasis versus infiltrate over the lower lung fields.  Blood cultures x 2 obtained.  Assessment & Plan:   Acute hypoxic respiratory failure, POA RSV viral infection Community-acquired pneumonia History of reactive airway disease with exacerbation Patient presenting with progressive shortness of breath, nonproductive cough and conversational dyspnea over the last 2 days after recent diagnosis of RSV.  Patient with temperature 99.2, WBC count of 8.4, BNP 68.8.  RSV PCR positive.  COVID/influenza PCR negative.  Chest x-ray with findings concerning for lower lung infiltrates consistent with pneumonia. -- Blood culture sent to: Pending -- Legionella and strep pneumo urinary antigen: Pending -- Azithromycin 500 mg p.o. daily x 5 days -- Ceftriaxone 2 g IV every 24 hours -- Mucinex 600 mg p.o. twice  daily -- Xopenex neb every 6 hours  --Atrovent neb every 6 hours -- Solu-Medrol 40 mg IV every 12 hours -- Continue supplemental oxygen, maintain SpO2 >92%  Hypokalemia Potassium 2.5, will replete today. -- Repeat electrolytes in a.m. to include magnesium  Essential hypertension -- Losartan 50 mg p.o. daily -- Hydralazine 10 mg p.o. every 6 hours as needed SBP >165  Hyperlipidemia -- Atorvastatin 10 mg p.o. daily  Hypothyroidism -- Levothyroxine 50 mcg p.o. daily  Type 2 diabetes mellitus Hemoglobin A1c 6.5.  Diet controlled at baseline. -- Moderate SSI for coverage -- CBG before every meal/at bedtime  OSA --Continue nocturnal CPAP  Chronic pain syndrome Degenerative lumbar sacral disease Osteoarthritis --Tylenol as needed  Weakness/deconditioning/gait disturbance: -- PT/OT evaluation   DVT prophylaxis: enoxaparin (LOVENOX) injection 40 mg Start: 03/06/24 1000 SCDs Start: 03/05/24 0500 Place TED hose Start: 03/05/24 0500    Code Status: Full Code Family Communication: No family present at bedside this morning  Disposition Plan:  Level of care: Telemetry Status is: Inpatient Remains inpatient appropriate because: IV antibiotics    Consultants:  None  Procedures:  None  Antimicrobials:  Azithromycin 3/13>> Ceftriaxone 3/13>>   Subjective: Patient seen examined bedside, lying in bed.  Remains in ED holding area.  Continues with shortness of breath, conversational dyspnea, dry throat; but slightly improved since initial presentation.  No otherquestions, concerns or complaints at this time.  Denies headache, no visual changes, no chest pain, no palpitations, no abdominal pain, no fever/chills/night sweats, no nausea/vomiting/diarrhea, no focal weakness, no fatigue, no paresthesia.  No acute events overnight per nursing.  Objective: Vitals:   03/05/24 0800  03/05/24 0900 03/05/24 1104 03/05/24 1200  BP: (!) 119/52 (!) 115/34  (!) 148/63  Pulse: 75 83  71   Resp: (!) 21 20  (!) 23  Temp:   98 F (36.7 C)   TempSrc:   Oral   SpO2: 100% 94%  94%  Weight:      Height:        Intake/Output Summary (Last 24 hours) at 03/05/2024 1315 Last data filed at 03/05/2024 1026 Gross per 24 hour  Intake 665.86 ml  Output --  Net 665.86 ml   Filed Weights   03/05/24 0522  Weight: 89.8 kg    Examination:  Physical Exam: GEN: NAD, alert and oriented x 3, obese HEENT: NCAT, PERRL, EOMI, sclera clear, MMM PULM: Breath sounds diminished bilateral bases with rhonchi, no wheezing, normal respiratory effort without accessory muscle use, noted conversational dyspnea, on 4 L nasal cannula with SpO2 95% at rest. CV: RRR w/o M/G/R GI: abd soft, NTND, NABS, no R/G/M MSK: no peripheral edema, muscle strength globally intact 5/5 bilateral upper/lower extremities NEURO: CN II-XII intact, no focal deficits, sensation to light touch intact PSYCH: normal mood/affect Integumentary: dry/intact, no rashes or wounds    Data Reviewed: I have personally reviewed following labs and imaging studies  CBC: Recent Labs  Lab 03/05/24 0318  WBC 8.4  NEUTROABS 4.8  HGB 12.7  HCT 39.1  MCV 87.5  PLT 240   Basic Metabolic Panel: Recent Labs  Lab 03/05/24 0438 03/05/24 0541  NA 132*  --   K 2.5*  --   CL 96*  --   CO2 23  --   GLUCOSE 200*  --   BUN 11  --   CREATININE 0.63  --   CALCIUM 8.2*  --   MG  --  2.1   GFR: Estimated Creatinine Clearance: 59.1 mL/min (by C-G formula based on SCr of 0.63 mg/dL). Liver Function Tests: Recent Labs  Lab 03/05/24 0438  AST 38  ALT 32  ALKPHOS 35*  BILITOT 0.7  PROT 7.0  ALBUMIN 3.6   No results for input(s): "LIPASE", "AMYLASE" in the last 168 hours. No results for input(s): "AMMONIA" in the last 168 hours. Coagulation Profile: No results for input(s): "INR", "PROTIME" in the last 168 hours. Cardiac Enzymes: No results for input(s): "CKTOTAL", "CKMB", "CKMBINDEX", "TROPONINI" in the last 168  hours. BNP (last 3 results) No results for input(s): "PROBNP" in the last 8760 hours. HbA1C: Recent Labs    03/05/24 0542  HGBA1C 6.5*   CBG: Recent Labs  Lab 03/05/24 0548 03/05/24 0749 03/05/24 1148  GLUCAP 276* 231* 265*   Lipid Profile: No results for input(s): "CHOL", "HDL", "LDLCALC", "TRIG", "CHOLHDL", "LDLDIRECT" in the last 72 hours. Thyroid Function Tests: No results for input(s): "TSH", "T4TOTAL", "FREET4", "T3FREE", "THYROIDAB" in the last 72 hours. Anemia Panel: No results for input(s): "VITAMINB12", "FOLATE", "FERRITIN", "TIBC", "IRON", "RETICCTPCT" in the last 72 hours. Sepsis Labs: Recent Labs  Lab 03/05/24 0541  PROCALCITON <0.10    Recent Results (from the past 240 hours)  Resp panel by RT-PCR (RSV, Flu A&B, Covid) Anterior Nasal Swab     Status: Abnormal   Collection Time: 03/05/24  3:19 AM   Specimen: Anterior Nasal Swab  Result Value Ref Range Status   SARS Coronavirus 2 by RT PCR NEGATIVE NEGATIVE Final    Comment: (NOTE) SARS-CoV-2 target nucleic acids are NOT DETECTED.  The SARS-CoV-2 RNA is generally detectable in upper respiratory specimens during the acute phase of  infection. The lowest concentration of SARS-CoV-2 viral copies this assay can detect is 138 copies/mL. A negative result does not preclude SARS-Cov-2 infection and should not be used as the sole basis for treatment or other patient management decisions. A negative result may occur with  improper specimen collection/handling, submission of specimen other than nasopharyngeal swab, presence of viral mutation(s) within the areas targeted by this assay, and inadequate number of viral copies(<138 copies/mL). A negative result must be combined with clinical observations, patient history, and epidemiological information. The expected result is Negative.  Fact Sheet for Patients:  BloggerCourse.com  Fact Sheet for Healthcare Providers:   SeriousBroker.it  This test is no t yet approved or cleared by the [place] FDA and  has been authorized for detection and/or diagnosis of SARS-CoV-2 by FDA under an Emergency Use Authorization (EUA). This EUA will remain  in effect (meaning this test can be used) for the duration of the COVID-19 declaration under Section 564(b)(1) of the Act, 21 U.S.C.section 360bbb-3(b)(1), unless the authorization is terminated  or revoked sooner.       Influenza A by PCR NEGATIVE NEGATIVE Final   Influenza B by PCR NEGATIVE NEGATIVE Final    Comment: (NOTE) The Xpert Xpress SARS-CoV-2/FLU/RSV plus assay is intended as an aid in the diagnosis of influenza from Nasopharyngeal swab specimens and should not be used as a sole basis for treatment. Nasal washings and aspirates are unacceptable for Xpert Xpress SARS-CoV-2/FLU/RSV testing.  Fact Sheet for Patients: BloggerCourse.com  Fact Sheet for Healthcare Providers: SeriousBroker.it  This test is not yet approved or cleared by the [place] FDA and has been authorized for detection and/or diagnosis of SARS-CoV-2 by FDA under an Emergency Use Authorization (EUA). This EUA will remain in effect (meaning this test can be used) for the duration of the COVID-19 declaration under Section 564(b)(1) of the Act, 21 U.S.C. section 360bbb-3(b)(1), unless the authorization is terminated or revoked.     Resp Syncytial Virus by PCR POSITIVE (A) NEGATIVE Final    Comment: (NOTE) Fact Sheet for Patients: BloggerCourse.com  Fact Sheet for Healthcare Providers: SeriousBroker.it  This test is not yet approved or cleared by the [place] FDA and has been authorized for detection and/or diagnosis of SARS-CoV-2 by FDA under an Emergency Use Authorization (EUA). This EUA will remain in effect (meaning this test can be used)  for the duration of the COVID-19 declaration under Section 564(b)(1) of the Act, 21 U.S.C. section 360bbb-3(b)(1), unless the authorization is terminated or revoked.  Performed at [facility], [address], [place], [place] [address]   Culture, blood (routine x 2) Call MD if unable to obtain prior to antibiotics being given     Status: None (Preliminary result)   Collection Time: 03/05/24  5:12 AM   Specimen: BLOOD LEFT HAND  Result Value Ref Range Status   Specimen Description   Final    BLOOD LEFT HAND Performed at [facility], [address], [place], [place] [address]    Special Requests   Final    BOTTLES DRAWN AEROBIC AND ANAEROBIC Blood Culture results may not be optimal due to an inadequate volume of blood received in culture bottles Performed at [facility], [address], [place], [place] [address]    Culture PENDING  Incomplete   Report Status PENDING  Incomplete         Radiology Studies: DG Chest Port 1 View Result Date: 03/05/2024 CLINICAL DATA:  Shortness of  breath EXAM: PORTABLE CHEST 1 VIEW COMPARISON:  02/20/2021 FINDINGS: Limited by kyphotic positioning. The chin overlaps the medial apices. Low volume chest with diffuse interstitial coarsening and hazy density at the bases. No Kerley lines, effusion, or pneumothorax. Stable heart size and mediastinal contours. IMPRESSION: Low volume chest with atelectasis or infiltrate over the lower lungs, accentuated by kyphotic positioning. Electronically Signed   By: Tiburcio Pea M.D.   On: 03/05/2024 04:38        Scheduled Meds:  acetylcysteine  3 mL Nebulization BID   atorvastatin  10 mg Oral Daily   [START ON 03/06/2024] azithromycin  500 mg Oral Daily   [START ON 03/06/2024] enoxaparin (LOVENOX) injection  40 mg Subcutaneous Q24H   guaiFENesin  600 mg Oral BID   insulin aspart  0-5 Units Subcutaneous QHS   insulin aspart  0-6 Units Subcutaneous TID WC    ipratropium  0.5 mg Nebulization Q6H   levalbuterol  0.63 mg Nebulization Q6H   levothyroxine  50 mcg Oral Q0600   losartan  50 mg Oral Daily   methylPREDNISolone (SOLU-MEDROL) injection  40 mg Intravenous Q12H   pantoprazole  40 mg Oral Daily   sodium chloride flush  3 mL Intravenous Q12H   sodium chloride flush  3 mL Intravenous Q12H   Continuous Infusions:  sodium chloride     [START ON 03/06/2024] cefTRIAXone (ROCEPHIN)  IV       LOS: 0 days    Time spent: 52 minutes spent on 03/05/2024 caring for this patient face-to-face including chart review, ordering labs/tests, documenting, discussion with nursing staff, consultants, updating family and interview/physical exam    Alvira Philips [place], DO [facility] Available via Epic secure chat 7am-7pm After these hours, please refer to coverage provider listed on [email or web address] 03/05/2024, 1:15 PM

## 2024-03-05 NOTE — ED Notes (Signed)
 Pt ok'd to have water by EDP.

## 2024-03-05 NOTE — ED Notes (Signed)
 Hospitalist at the bedside

## 2024-03-06 ENCOUNTER — Other Ambulatory Visit: Payer: Self-pay

## 2024-03-06 DIAGNOSIS — J9601 Acute respiratory failure with hypoxia: Secondary | ICD-10-CM | POA: Diagnosis not present

## 2024-03-06 LAB — GLUCOSE, CAPILLARY
Glucose-Capillary: 163 mg/dL — ABNORMAL HIGH (ref 70–99)
Glucose-Capillary: 220 mg/dL — ABNORMAL HIGH (ref 70–99)
Glucose-Capillary: 139 mg/dL — ABNORMAL HIGH (ref 70–99)
Glucose-Capillary: 206 mg/dL — ABNORMAL HIGH (ref 70–99)

## 2024-03-06 LAB — CBC
HCT: 35.8 % — ABNORMAL LOW (ref 36.0–46.0)
Hemoglobin: 11.5 g/dL — ABNORMAL LOW (ref 12.0–15.0)
MCH: 28.6 pg (ref 26.0–34.0)
MCHC: 32.1 g/dL (ref 30.0–36.0)
MCV: 89.1 fL (ref 80.0–100.0)
Platelets: 228 10*3/uL (ref 150–400)
RBC: 4.02 MIL/uL (ref 3.87–5.11)
RDW: 13.6 % (ref 11.5–15.5)
WBC: 11.3 10*3/uL — ABNORMAL HIGH (ref 4.0–10.5)
nRBC: 0 % (ref 0.0–0.2)

## 2024-03-06 LAB — COMPREHENSIVE METABOLIC PANEL
ALT: 30 U/L (ref 0–44)
AST: 29 U/L (ref 15–41)
Albumin: 3.2 g/dL — ABNORMAL LOW (ref 3.5–5.0)
Alkaline Phosphatase: 33 U/L — ABNORMAL LOW (ref 38–126)
Anion gap: 9 (ref 5–15)
BUN: 17 mg/dL (ref 8–23)
CO2: 23 mmol/L (ref 22–32)
Calcium: 8.5 mg/dL — ABNORMAL LOW (ref 8.9–10.3)
Chloride: 104 mmol/L (ref 98–111)
Creatinine, Ser: 0.61 mg/dL (ref 0.44–1.00)
GFR, Estimated: >60 mL/min (ref >60)
Glucose, Bld: 142 mg/dL — ABNORMAL HIGH (ref 70–99)
Potassium: 3.8 mmol/L (ref 3.5–5.1)
Sodium: 136 mmol/L (ref 135–145)
Total Bilirubin: 0.4 mg/dL (ref 0.0–1.2)
Total Protein: 6.8 g/dL (ref 6.5–8.1)

## 2024-03-06 LAB — MAGNESIUM: Magnesium: 2.1 mg/dL (ref 1.7–2.4)

## 2024-03-06 MED ORDER — LEVALBUTEROL HCL 0.63 MG/3ML IN NEBU
0.6300 mg | INHALATION_SOLUTION | Freq: Three times a day (TID) | RESPIRATORY_TRACT | Status: DC
  Administered 2024-03-07: 0.63 mg via RESPIRATORY_TRACT
  Filled 2024-03-06: qty 3

## 2024-03-06 MED ORDER — IPRATROPIUM BROMIDE 0.02 % IN SOLN
0.5000 mg | Freq: Three times a day (TID) | RESPIRATORY_TRACT | Status: DC
  Administered 2024-03-07: 0.5 mg via RESPIRATORY_TRACT
  Filled 2024-03-06: qty 2.5

## 2024-03-06 MED ORDER — POTASSIUM CHLORIDE CRYS ER 20 MEQ PO TBCR
20.0000 meq | EXTENDED_RELEASE_TABLET | Freq: Once | ORAL | Status: AC
  Administered 2024-03-06: 20 meq via ORAL
  Filled 2024-03-06: qty 1

## 2024-03-06 NOTE — Plan of Care (Signed)
  Problem: Pain Managment: Goal: General experience of comfort will improve and/or be controlled Outcome: Progressing   Problem: Safety: Goal: Ability to remain free from injury will improve Outcome: Progressing   Problem: Respiratory: Goal: Ability to maintain adequate ventilation will improve Outcome: Progressing

## 2024-03-06 NOTE — Progress Notes (Signed)
   03/06/24 2008  BiPAP/CPAP/SIPAP  BiPAP/CPAP/SIPAP Pt Type Adult  Reason BIPAP/CPAP not in use Non-compliant  BiPAP/CPAP /SiPAP Vitals  SpO2 96 %  Bilateral Breath Sounds Clear;Diminished

## 2024-03-06 NOTE — Plan of Care (Signed)
  Problem: Education: Goal: Ability to describe self-care measures that may prevent or decrease complications (Diabetes Survival Skills Education) will improve Outcome: Progressing Goal: Individualized Educational Video(s) Outcome: Progressing   Problem: Coping: Goal: Ability to adjust to condition or change in health will improve Outcome: Progressing   Problem: Fluid Volume: Goal: Ability to maintain a balanced intake and output will improve Outcome: Progressing   Problem: Health Behavior/Discharge Planning: Goal: Ability to identify and utilize available resources and services will improve Outcome: Progressing Goal: Ability to manage health-related needs will improve Outcome: Progressing   Problem: Metabolic: Goal: Ability to maintain appropriate glucose levels will improve Outcome: Progressing   Problem: Nutritional: Goal: Maintenance of adequate nutrition will improve Outcome: Progressing Goal: Progress toward achieving an optimal weight will improve Outcome: Progressing   Problem: Skin Integrity: Goal: Risk for impaired skin integrity will decrease Outcome: Progressing   Problem: Tissue Perfusion: Goal: Adequacy of tissue perfusion will improve Outcome: Progressing   Problem: Education: Goal: Knowledge of General Education information will improve Description: Including pain rating scale, medication(s)/side effects and non-pharmacologic comfort measures Outcome: Progressing   Problem: Health Behavior/Discharge Planning: Goal: Ability to manage health-related needs will improve Outcome: Progressing   Problem: Clinical Measurements: Goal: Ability to maintain clinical measurements within normal limits will improve Outcome: Progressing Goal: Will remain free from infection Outcome: Progressing Goal: Diagnostic test results will improve Outcome: Progressing Goal: Respiratory complications will improve Outcome: Progressing Goal: Cardiovascular complication will  be avoided Outcome: Progressing   Problem: Activity: Goal: Risk for activity intolerance will decrease Outcome: Progressing   Problem: Nutrition: Goal: Adequate nutrition will be maintained Outcome: Progressing   Problem: Coping: Goal: Level of anxiety will decrease Outcome: Progressing   Problem: Elimination: Goal: Will not experience complications related to bowel motility Outcome: Progressing Goal: Will not experience complications related to urinary retention Outcome: Progressing   Problem: Pain Managment: Goal: General experience of comfort will improve and/or be controlled Outcome: Progressing   Problem: Safety: Goal: Ability to remain free from injury will improve Outcome: Progressing   Problem: Skin Integrity: Goal: Risk for impaired skin integrity will decrease Outcome: Progressing   Problem: Activity: Goal: Ability to tolerate increased activity will improve Outcome: Progressing   Problem: Clinical Measurements: Goal: Ability to maintain a body temperature in the normal range will improve Outcome: Progressing   Problem: Respiratory: Goal: Ability to maintain a clear airway will improve Outcome: Progressing   Problem: Respiratory: Goal: Ability to maintain adequate ventilation will improve Outcome: Not Progressing

## 2024-03-06 NOTE — Evaluation (Signed)
 Physical Therapy Evaluation Patient Details Name: Mary Hayes MRN: [id number] DOB: 1941/01/10 Today's Date: 03/06/2024  History of Present Illness  Pt is 83 yo female admitted on 03/05/24 with resp failure due to RSV with CAP.  Pt with hx including HTN, HLD, hypothyroidism, DM2, OSA, OA, degenerative lumbar sacral disease, reactive airway disease, chronic pain syndrome.  Clinical Impression  Pt admitted with above diagnosis.  She is normally independent at home but does have son nearby if needed.  Pt has been ambulating in room independently and demonstrated safely during PT session.  She additionally ambulated 200' in hallway with supervision with O2 sats stable on RA.  Educated pt on gradually increasing endurance with return home.  She verbalized understanding and additionally reports when she feels weaker she uses her cane.  Pt near baseline and with no further PT needs.     O2 SATS:  Pt on 2 L but had removed to walk to bathroom with sats 93%.  Kept on RA sats up to 96% rest and 92% ambulation in hallway. Left on RA notified RN      If plan is discharge home, recommend the following: Assistance with cooking/housework;Help with stairs or ramp for entrance   Can travel by private vehicle        Equipment Recommendations None recommended by PT  Recommendations for Other Services       Functional Status Assessment Patient has not had a recent decline in their functional status     Precautions / Restrictions Precautions Precautions: Fall      Mobility  Bed Mobility               General bed mobility comments: in chair    Transfers Overall transfer level: Modified independent Equipment used: None               General transfer comment: Demonstrated STS from chair and toielt independently; has been mobilizing in room on her own    Ambulation/Gait Ambulation/Gait assistance: Supervision Gait Distance (Feet): 200 Feet Assistive device: None Gait  Pattern/deviations: Step-through pattern Gait velocity: functional     General Gait Details: Ambulated in hallway with supervision and occasional use of handrail  Stairs            Wheelchair Mobility     Tilt Bed    Modified Rankin (Stroke Patients Only)       Balance Overall balance assessment: Needs assistance Sitting-balance support: No upper extremity supported Sitting balance-Leahy Scale: Good     Standing balance support: No upper extremity supported Standing balance-Leahy Scale: Good                               Pertinent Vitals/Pain Pain Assessment Pain Assessment: No/denies pain    Home Living Family/patient expects to be discharged to:: Private residence Living Arrangements: Alone Available Help at Discharge: Family;Available PRN/intermittently Type of Home: House Home Access: Level entry     Alternate Level Stairs-Number of Steps: 12 Home Layout: Two level;Bed/bath upstairs Home Equipment: Rolling Walker (2 wheels);Cane - single point Additional Comments: One son lives nearby    Prior Function Prior Level of Function : Independent/Modified Independent             Mobility Comments: Reports can ambulate in community.  States uses grocery cart to hold when walking in community, household without AD, occasionally uses cane indoor if feeling weak that day. ADLs Comments: Reports independent with adls  and iadls     Extremity/Trunk Assessment   Upper Extremity Assessment Upper Extremity Assessment: Overall WFL for tasks assessed    Lower Extremity Assessment Lower Extremity Assessment: Overall WFL for tasks assessed    Cervical / Trunk Assessment Cervical / Trunk Assessment: Normal  Communication   Communication Communication: Other (comment) (Speaks [place] , but can communicate in [place])    Cognition Arousal: Alert Behavior During Therapy: WFL for tasks assessed/performed   PT - Cognitive impairments: No apparent  impairments                                 Cueing       General Comments General comments (skin integrity, edema, etc.): Pt on 2 L but had removed to walk to bathroom with sats 93%.  Kept on RA sats up to 96% rest and 92% ambulation in hallway. Left on RA notified RN    Exercises     Assessment/Plan    PT Assessment Patient does not need any further PT services  PT Problem List         PT Treatment Interventions      PT Goals (Current goals can be found in the Care Plan section)  Acute Rehab PT Goals Patient Stated Goal: return home PT Goal Formulation: All assessment and education complete, DC therapy    Frequency       Co-evaluation               AM-PAC PT "6 Clicks" Mobility  Outcome Measure Help needed turning from your back to your side while in a flat bed without using bedrails?: None Help needed moving from lying on your back to sitting on the side of a flat bed without using bedrails?: None Help needed moving to and from a bed to a chair (including a wheelchair)?: None Help needed standing up from a chair using your arms (e.g., wheelchair or bedside chair)?: None Help needed to walk in hospital room?: None Help needed climbing 3-5 steps with a railing? : A Little 6 Click Score: 23    End of Session Equipment Utilized During Treatment: Gait belt Activity Tolerance: Patient tolerated treatment well Patient left: in chair;with call bell/phone within reach Nurse Communication: Mobility status PT Visit Diagnosis: Other abnormalities of gait and mobility (R26.89)    Time: 1133-1150 PT Time Calculation (min) (ACUTE ONLY): 17 min   Charges:   PT Evaluation $PT Eval Low Complexity: 1 Low   PT General Charges $$ ACUTE PT VISIT: 1 Visit         Mary Hayes, PT Acute Rehab Services [facility] [phone]   Mary Hayes 03/06/2024, 12:03 PM

## 2024-03-06 NOTE — Evaluation (Signed)
 Occupational Therapy Evaluation Patient Details Name: Mary Hayes MRN: [id number] DOB: 11-19-1941 Today's Date: 03/06/2024   History of Present Illness   Pt is 83 yo female admitted on 03/05/24 with resp failure due to RSV with CAP.  Pt with hx including HTN, HLD, hypothyroidism, DM2, OSA, OA, degenerative lumbar sacral disease, reactive airway disease, chronic pain syndrome.     Clinical Impressions Patient evaluated by Occupational Therapy with no further acute OT needs identified. All education has been completed and the patient has no further questions. Patient endorsed being at baseline for Adls at this time and not needing further OT. Patient has been MI for toileting tasks with education on not furniture walking at this time. Patient O2 noted to drop to 86% with activity while talking and able to bring back up within 45 seconds on RA. Patient was educated on stopping for deep breathing when SOB.  See below for any follow-up Occupational Therapy or equipment needs. OT is signing off. Thank you for this referral.      If plan is discharge home, recommend the following:   Assistance with cooking/housework     Functional Status Assessment   Patient has not had a recent decline in their functional status     Equipment Recommendations   None recommended by OT      Precautions/Restrictions   Precautions Precautions: Fall Precaution/Restrictions Comments: monitor O2 Restrictions Weight Bearing Restrictions Per Provider Order: No     Mobility Bed Mobility               General bed mobility comments: in chair              ADL either performed or assessed with clinical judgement   ADL         General ADL Comments: patient was supervision for ADLs in room with patient noted to have O2 dropping to 86% on RA standing and talkin with therapist after simulated toileting tasks. patient endoresed SOB and was bel to deep breath back up to 91% while  standing. patient was educated on stopping to breath when feeling SOB. patient verbalized understanding. patietn has supplemental O2 in room with O2 cord too short to reach bathroom. nurse made aware. patients nurse to further address O2 cord length issue.     Vision Patient Visual Report: No change from baseline              Pertinent Vitals/Pain Pain Assessment Pain Assessment: No/denies pain     Extremity/Trunk Assessment Upper Extremity Assessment Upper Extremity Assessment: Overall WFL for tasks assessed   Lower Extremity Assessment Lower Extremity Assessment: Overall WFL for tasks assessed   Cervical / Trunk Assessment Cervical / Trunk Assessment: Normal   Communication Communication Communication: Other (comment) [address] [place] , but can communicate in [place])   Cognition Arousal: Alert Behavior During Therapy: WFL for tasks assessed/performed Cognition: No apparent impairments             OT - Cognition Comments: patient reported that she has lived in [place] for 10 years now. patient is an [place] speaker but was able to communicate in english during session.                 Following commands: Intact       Cueing  General Comments      Pt on 2 L but had removed to walk to bathroom with sats 93%.  Kept on RA sats up to 96% rest and 92% ambulation in hallway.  Left on RA notified RN           Home Living Family/patient expects to be discharged to:: Private residence Living Arrangements: Alone Available Help at Discharge: Family;Available PRN/intermittently Type of Home: House Home Access: Level entry     Home Layout: Two level;Bed/bath upstairs Alternate Level Stairs-Number of Steps: 12   Bathroom Shower/Tub: Producer, television/film/video: Standard     Home Equipment: Agricultural consultant (2 wheels);Cane - single point   Additional Comments: One son lives nearby      Prior Functioning/Environment Prior Level of Function :  Independent/Modified Independent             Mobility Comments: Reports can ambulate in community.  States uses grocery cart to hold when walking in community, household without AD, occasionally uses cane indoor if feeling weak that day. ADLs Comments: Reports independent with adls and iadls    OT Problem List: Decreased activity tolerance   OT Treatment/Interventions:                AM-PAC OT "6 Clicks" Daily Activity     Outcome Measure Help from another person eating meals?: None Help from another person taking care of personal grooming?: None Help from another person toileting, which includes using toliet, bedpan, or urinal?: A Little Help from another person bathing (including washing, rinsing, drying)?: A Little Help from another person to put on and taking off regular upper body clothing?: A Little Help from another person to put on and taking off regular lower body clothing?: A Little 6 Click Score: 20   End of Session Equipment Utilized During Treatment: Gait belt Nurse Communication: Mobility status;Other (comment) (O2 during session)  Activity Tolerance: Patient tolerated treatment well Patient left: in chair;with call bell/phone within reach  OT Visit Diagnosis: Unsteadiness on feet (R26.81)                Time: 1050-1105 OT Time Calculation (min): 15 min Charges:  OT General Charges $OT Visit: 1 Visit OT Evaluation $OT Eval Low Complexity: 1 Low  Janyiah Silveri OTR/L, MS Acute Rehabilitation Department Office# [phone]   Selinda Flavin 03/06/2024, 1:01 PM

## 2024-03-06 NOTE — Progress Notes (Signed)
 PROGRESS NOTE    Mary Hayes  BJY:[id number] DOB: [birth year] DOA: 03/05/2024 PCP: [facility] [place]    Brief Narrative:   Mary Hayes is a 83 y.o. female with past medical history significant for HTN, HLD, hypothyroidism, DM2, OSA, osteoarthritis, degenerative lumbar sacral disease, reactive airway disease, chronic pain syndrome who presented to [facility] ED on 03/05/2024 with progressive shortness of breath over the last 2 days associated with nonproductive cough.  Reports recently diagnosed with RSV few days prior; and since has had progressive symptoms.  Unable to complete a full sentence without catching her breath.  Denies fever, no chills.  Denies tobacco use.  In the ED, temperature 99.2 F, HR 125, RR 28, BP 160/75, SpO2 93% on 4 L nasal cannula.  WBC 8.4, hemoglobin 12.7, platelet count 240.  Sodium 132, potassium 2.5, chloride 96, CO2 23, glucose 200, BUN 11, creatinine 0.62.  AST 38, ALT 32, total bilirubin 0.7.  High sensitive troponin 9>57.  BNP 68.8.  COVID/influenza PCR negative.  RSV PCR positive.  Chest x-ray with low volume chest with atelectasis versus infiltrate over the lower lung fields.  Blood cultures x 2 obtained.  Assessment & Plan:   Acute hypoxic respiratory failure, POA RSV viral infection Community-acquired pneumonia History of reactive airway disease with exacerbation Patient presenting with progressive shortness of breath, nonproductive cough and conversational dyspnea over the last 2 days after recent diagnosis of RSV.  Patient with temperature 99.2, WBC count of 8.4, BNP 68.8.  RSV PCR positive.  COVID/influenza PCR negative.  Chest x-ray with findings concerning for lower lung infiltrates consistent with pneumonia. -- Blood culture x 2: No growth less than 24 hours -- Legionella and strep pneumo urinary antigen: Pending -- Azithromycin 500 mg p.o. daily x 5 days -- Ceftriaxone 2 g IV every 24 hours -- Mucinex 600  mg p.o. twice daily -- Xopenex neb every 6 hours  -- Atrovent neb every 6 hours -- Solu-Medrol 40 mg IV every 12 hours -- Continue supplemental oxygen, maintain SpO2 >92% -- Ambulatory O2 screen today  Hypokalemia Repleted, 3.7 this morning. -- Repeat electrolytes in a.m. to include magnesium  Essential hypertension -- Losartan 50 mg p.o. daily -- Hydralazine 10 mg p.o. every 6 hours as needed SBP >165  Hyperlipidemia -- Atorvastatin 10 mg p.o. daily  Hypothyroidism -- Levothyroxine 50 mcg p.o. daily  Type 2 diabetes mellitus Hemoglobin A1c 6.5.  Diet controlled at baseline. -- Moderate SSI for coverage -- CBG before every meal/at bedtime  OSA --Continue nocturnal CPAP  Chronic pain syndrome Degenerative lumbar sacral disease Osteoarthritis --Tylenol as needed  Weakness/deconditioning/gait disturbance: -- PT with no follow-up recommended -- OT evaluation: Pending   DVT prophylaxis: enoxaparin (LOVENOX) injection 40 mg Start: 03/06/24 1000 SCDs Start: 03/05/24 0500 Place TED hose Start: 03/05/24 0500    Code Status: Full Code Family Communication: No family present at bedside this morning  Disposition Plan:  Level of care: Telemetry Status is: Inpatient Remains inpatient appropriate because: IV antibiotics; IV steroids, anticipate discharge home in 1-2 days    Consultants:  None  Procedures:  None  Antimicrobials:  Azithromycin 3/13>> Ceftriaxone 3/13>>   Subjective: Patient seen examined bedside, lying in bed.  Reports shortness of breath improved, continues on oxygen, weaned down to 2 L nasal cannula this morning.  Complaining of dry throat, Cepacol lozenges help.  No otherquestions, concerns or complaints at this time.  Denies headache, no visual changes, no chest pain, no palpitations,  no abdominal pain, no fever/chills/night sweats, no nausea/vomiting/diarrhea, no focal weakness, no fatigue, no paresthesia.  No acute events overnight per  nursing.  Objective: Vitals:   03/05/24 1933 03/06/24 0146 03/06/24 0504 03/06/24 0907  BP:   (!) 110/53   Pulse:   73   Resp:   20   Temp:   98.3 F (36.8 C)   TempSrc:      SpO2: 96% 96% 98% 93%  Weight:      Height:        Intake/Output Summary (Last 24 hours) at 03/06/2024 1245 Last data filed at 03/06/2024 0500 Gross per 24 hour  Intake 209.95 ml  Output --  Net 209.95 ml   Filed Weights   03/05/24 0522  Weight: 89.8 kg    Examination:  Physical Exam: GEN: NAD, alert and oriented x 3, obese HEENT: NCAT, PERRL, EOMI, sclera clear, MMM PULM: Breath sounds diminished bilateral bases with rhonchi, no wheezing, normal respiratory effort without accessory muscle use, on 2 L nasal cannula with SpO2 98% at rest. CV: RRR w/o M/G/R GI: abd soft, NTND, NABS, no R/G/M MSK: no peripheral edema, muscle strength globally intact 5/5 bilateral upper/lower extremities NEURO: CN II-XII intact, no focal deficits, sensation to light touch intact PSYCH: normal mood/affect Integumentary: dry/intact, no rashes or wounds    Data Reviewed: I have personally reviewed following labs and imaging studies  CBC: Recent Labs  Lab 03/05/24 0318 03/06/24 0630  WBC 8.4 11.3*  NEUTROABS 4.8  --   HGB 12.7 11.5*  HCT 39.1 35.8*  MCV 87.5 89.1  PLT 240 228   Basic Metabolic Panel: Recent Labs  Lab 03/05/24 0438 03/05/24 0541 03/06/24 0630  NA 132*  --  136  K 2.5*  --  3.8  CL 96*  --  104  CO2 23  --  23  GLUCOSE 200*  --  142*  BUN 11  --  17  CREATININE 0.63  --  0.61  CALCIUM 8.2*  --  8.5*  MG  --  2.1 2.1   GFR: Estimated Creatinine Clearance: 59.1 mL/min (by C-G formula based on SCr of 0.61 mg/dL). Liver Function Tests: Recent Labs  Lab 03/05/24 0438 03/06/24 0630  AST 38 29  ALT 32 30  ALKPHOS 35* 33*  BILITOT 0.7 0.4  PROT 7.0 6.8  ALBUMIN 3.6 3.2*   No results for input(s): "LIPASE", "AMYLASE" in the last 168 hours. No results for input(s): "AMMONIA" in  the last 168 hours. Coagulation Profile: No results for input(s): "INR", "PROTIME" in the last 168 hours. Cardiac Enzymes: No results for input(s): "CKTOTAL", "CKMB", "CKMBINDEX", "TROPONINI" in the last 168 hours. BNP (last 3 results) No results for input(s): "PROBNP" in the last 8760 hours. HbA1C: Recent Labs    03/05/24 0542  HGBA1C 6.5*   CBG: Recent Labs  Lab 03/05/24 1148 03/05/24 1631 03/05/24 2041 03/06/24 0748 03/06/24 1131  GLUCAP 265* 254* 224* 163* 220*   Lipid Profile: No results for input(s): "CHOL", "HDL", "LDLCALC", "TRIG", "CHOLHDL", "LDLDIRECT" in the last 72 hours. Thyroid Function Tests: No results for input(s): "TSH", "T4TOTAL", "FREET4", "T3FREE", "THYROIDAB" in the last 72 hours. Anemia Panel: No results for input(s): "VITAMINB12", "FOLATE", "FERRITIN", "TIBC", "IRON", "RETICCTPCT" in the last 72 hours. Sepsis Labs: Recent Labs  Lab 03/05/24 0541  PROCALCITON <0.10    Recent Results (from the past 240 hours)  Resp panel by RT-PCR (RSV, Flu A&B, Covid) Anterior Nasal Swab     Status: Abnormal  Collection Time: 03/05/24  3:19 AM   Specimen: Anterior Nasal Swab  Result Value Ref Range Status   SARS Coronavirus 2 by RT PCR NEGATIVE NEGATIVE Final    Comment: (NOTE) SARS-CoV-2 target nucleic acids are NOT DETECTED.  The SARS-CoV-2 RNA is generally detectable in upper respiratory specimens during the acute phase of infection. The lowest concentration of SARS-CoV-2 viral copies this assay can detect is 138 copies/mL. A negative result does not preclude SARS-Cov-2 infection and should not be used as the sole basis for treatment or other patient management decisions. A negative result may occur with  improper specimen collection/handling, submission of specimen other than nasopharyngeal swab, presence of viral mutation(s) within the areas targeted by this assay, and inadequate number of viral copies(<138 copies/mL). A negative result must be  combined with clinical observations, patient history, and epidemiological information. The expected result is Negative.  Fact Sheet for Patients:  BloggerCourse.com  Fact Sheet for Healthcare Providers:  SeriousBroker.it  This test is no t yet approved or cleared by the [place] FDA and  has been authorized for detection and/or diagnosis of SARS-CoV-2 by FDA under an Emergency Use Authorization (EUA). This EUA will remain  in effect (meaning this test can be used) for the duration of the COVID-19 declaration under Section 564(b)(1) of the Act, 21 U.S.C.section 360bbb-3(b)(1), unless the authorization is terminated  or revoked sooner.       Influenza A by PCR NEGATIVE NEGATIVE Final   Influenza B by PCR NEGATIVE NEGATIVE Final    Comment: (NOTE) The Xpert Xpress SARS-CoV-2/FLU/RSV plus assay is intended as an aid in the diagnosis of influenza from Nasopharyngeal swab specimens and should not be used as a sole basis for treatment. Nasal washings and aspirates are unacceptable for Xpert Xpress SARS-CoV-2/FLU/RSV testing.  Fact Sheet for Patients: BloggerCourse.com  Fact Sheet for Healthcare Providers: SeriousBroker.it  This test is not yet approved or cleared by the [place] FDA and has been authorized for detection and/or diagnosis of SARS-CoV-2 by FDA under an Emergency Use Authorization (EUA). This EUA will remain in effect (meaning this test can be used) for the duration of the COVID-19 declaration under Section 564(b)(1) of the Act, 21 U.S.C. section 360bbb-3(b)(1), unless the authorization is terminated or revoked.     Resp Syncytial Virus by PCR POSITIVE (A) NEGATIVE Final    Comment: (NOTE) Fact Sheet for Patients: BloggerCourse.com  Fact Sheet for Healthcare Providers: SeriousBroker.it  This test is not  yet approved or cleared by the [place] FDA and has been authorized for detection and/or diagnosis of SARS-CoV-2 by FDA under an Emergency Use Authorization (EUA). This EUA will remain in effect (meaning this test can be used) for the duration of the COVID-19 declaration under Section 564(b)(1) of the Act, 21 U.S.C. section 360bbb-3(b)(1), unless the authorization is terminated or revoked.  Performed at [facility], [address], [place], [place] [address]   Culture, blood (routine x 2) Call MD if unable to obtain prior to antibiotics being given     Status: None (Preliminary result)   Collection Time: 03/05/24  5:11 AM   Specimen: BLOOD  Result Value Ref Range Status   Specimen Description   Final    BLOOD LEFT ANTECUBITAL Performed at [facility], [address] [place], [place] [address]    Special Requests   Final    BOTTLES DRAWN AEROBIC AND ANAEROBIC Blood Culture results may not be optimal due to an inadequate volume of  blood received in culture bottles Performed at [facility], [address] [place], [place] [address]    Culture   Final    NO GROWTH < 24 HOURS Performed at [facility], [address] [place], [place] [address]    Report Status PENDING  Incomplete  Culture, blood (routine x 2) Call MD if unable to obtain prior to antibiotics being given     Status: None (Preliminary result)   Collection Time: 03/05/24  5:12 AM   Specimen: BLOOD LEFT HAND  Result Value Ref Range Status   Specimen Description   Final    BLOOD LEFT HAND Performed at [facility], [address] [place], [place] [address]    Special Requests   Final    BOTTLES DRAWN AEROBIC AND ANAEROBIC Blood Culture results may not be optimal due to an inadequate volume of blood received in culture bottles Performed at [facility], [address] [place], [place] [address]    Culture   Final    NO  GROWTH < 24 HOURS Performed at [facility], [address] [place], [place] [address]    Report Status PENDING  Incomplete         Radiology Studies: DG Chest Port 1 View Result Date: 03/05/2024 CLINICAL DATA:  Shortness of breath EXAM: PORTABLE CHEST 1 VIEW COMPARISON:  02/20/2021 FINDINGS: Limited by kyphotic positioning. The chin overlaps the medial apices. Low volume chest with diffuse interstitial coarsening and hazy density at the bases. No Kerley lines, effusion, or pneumothorax. Stable heart size and mediastinal contours. IMPRESSION: Low volume chest with atelectasis or infiltrate over the lower lungs, accentuated by kyphotic positioning. Electronically Signed   By: Tiburcio Pea M.D.   On: 03/05/2024 04:38        Scheduled Meds:  acetylcysteine  3 mL Nebulization BID   atorvastatin  10 mg Oral Daily   azithromycin  500 mg Oral Daily   enoxaparin (LOVENOX) injection  40 mg Subcutaneous Q24H   guaiFENesin  600 mg Oral BID   insulin aspart  0-5 Units Subcutaneous QHS   insulin aspart  0-6 Units Subcutaneous TID WC   ipratropium  0.5 mg Nebulization Q6H   levalbuterol  0.63 mg Nebulization Q6H   levothyroxine  50 mcg Oral Q0600   losartan  50 mg Oral Daily   methylPREDNISolone (SOLU-MEDROL) injection  40 mg Intravenous Q12H   pantoprazole  40 mg Oral Daily   sodium chloride flush  3 mL Intravenous Q12H   sodium chloride flush  3 mL Intravenous Q12H   Continuous Infusions:  cefTRIAXone (ROCEPHIN)  IV 2 g (03/06/24 0407)     LOS: 1 day    Time spent: 52 minutes spent on 03/06/2024 caring for this patient face-to-face including chart review, ordering labs/tests, documenting, discussion with nursing staff, consultants, updating family and interview/physical exam    Alvira Philips [place], DO [facility] Available via Epic secure chat 7am-7pm After these hours, please refer to coverage provider listed on [email or web address] 03/06/2024, 12:45 PM

## 2024-03-07 DIAGNOSIS — J9601 Acute respiratory failure with hypoxia: Secondary | ICD-10-CM | POA: Diagnosis not present

## 2024-03-07 LAB — BASIC METABOLIC PANEL
Anion gap: 10 (ref 5–15)
BUN: 26 mg/dL — ABNORMAL HIGH (ref 8–23)
CO2: 24 mmol/L (ref 22–32)
Calcium: 8.7 mg/dL — ABNORMAL LOW (ref 8.9–10.3)
Chloride: 105 mmol/L (ref 98–111)
Creatinine, Ser: 0.79 mg/dL (ref 0.44–1.00)
GFR, Estimated: >60 mL/min (ref >60)
Glucose, Bld: 100 mg/dL — ABNORMAL HIGH (ref 70–99)
Potassium: 4 mmol/L (ref 3.5–5.1)
Sodium: 139 mmol/L (ref 135–145)

## 2024-03-07 LAB — CBC
HCT: 37.3 % (ref 36.0–46.0)
Hemoglobin: 11.7 g/dL — ABNORMAL LOW (ref 12.0–15.0)
MCH: 28.2 pg (ref 26.0–34.0)
MCHC: 31.4 g/dL (ref 30.0–36.0)
MCV: 89.9 fL (ref 80.0–100.0)
Platelets: 253 10*3/uL (ref 150–400)
RBC: 4.15 MIL/uL (ref 3.87–5.11)
RDW: 13.9 % (ref 11.5–15.5)
WBC: 13.3 10*3/uL — ABNORMAL HIGH (ref 4.0–10.5)
nRBC: 0 % (ref 0.0–0.2)

## 2024-03-07 LAB — GLUCOSE, CAPILLARY: Glucose-Capillary: 149 mg/dL — ABNORMAL HIGH (ref 70–99)

## 2024-03-07 MED ORDER — CEFDINIR 300 MG PO CAPS: 300.0000 mg | ORAL_CAPSULE | Freq: Two times a day (BID) | ORAL | 0 refills | Status: AC

## 2024-03-07 MED ORDER — PREDNISONE 10 MG PO TABS
ORAL_TABLET | ORAL | 0 refills | Status: AC
Start: 2024-03-08 — End: 2024-03-16

## 2024-03-07 MED ORDER — ALBUTEROL SULFATE HFA 108 (90 BASE) MCG/ACT IN AERS
1.0000 | INHALATION_SPRAY | RESPIRATORY_TRACT | 3 refills | Status: AC | PRN
Start: 2024-03-07 — End: ?

## 2024-03-07 MED ORDER — PREDNISONE 20 MG PO TABS
40.0000 mg | ORAL_TABLET | Freq: Every day | ORAL | Status: DC
  Administered 2024-03-07: 40 mg via ORAL
  Filled 2024-03-07: qty 2

## 2024-03-07 MED ORDER — GUAIFENESIN ER 600 MG PO TB12
600.0000 mg | ORAL_TABLET | Freq: Two times a day (BID) | ORAL | 0 refills | Status: AC
Start: 2024-03-07 — End: 2024-03-14

## 2024-03-07 MED ORDER — GUAIFENESIN-DM 100-10 MG/5ML PO SYRP: 5.0000 mL | ORAL_SOLUTION | ORAL | 0 refills | Status: AC | PRN

## 2024-03-07 MED ORDER — AZITHROMYCIN 500 MG PO TABS: 500.0000 mg | ORAL_TABLET | Freq: Every day | ORAL | 0 refills | Status: AC

## 2024-03-07 NOTE — Plan of Care (Signed)
   Problem: Safety: Goal: Ability to remain free from injury will improve Outcome: Progressing   Problem: Nutrition: Goal: Adequate nutrition will be maintained Outcome: Progressing   Problem: Skin Integrity: Goal: Risk for impaired skin integrity will decrease Outcome: Progressing

## 2024-03-07 NOTE — Progress Notes (Signed)
 Mobility Specialist - Progress Note  (RA) Pre-mobility: 77 bpm HR, 95% SpO2 During mobility: 105 bpm HR, 94% SpO2 Post-mobility: 102 bpm HR, 96% SPO2   03/07/24 0939  Mobility  Activity Ambulated with assistance in hallway  Level of Assistance Standby assist, set-up cues, supervision of patient - no hands on  Assistive Device Other (Comment) (Hallway Rails)  Distance Ambulated (ft) 200 ft  Range of Motion/Exercises Active  Activity Response Tolerated well  Mobility Referral Yes  Mobility visit 1 Mobility  Mobility Specialist Start Time (ACUTE ONLY) [id number]  Mobility Specialist Stop Time (ACUTE ONLY) [id number]  Mobility Specialist Time Calculation (min) (ACUTE ONLY) 10 min   Pt was found sitting EOB and agreeable to ambulate. Stated feeling weak during session. At EOS returned to sit EOB with all needs met. Call bell in reach and family in room.  Billey Chang Mobility Specialist

## 2024-03-07 NOTE — Plan of Care (Signed)

## 2024-03-07 NOTE — Discharge Summary (Signed)
 Physician Discharge Summary  Mary Hayes DOB: March 13, 1941 DOA: 03/05/2024  PCP: [facility] [place]  Admit date: 03/05/2024 Discharge date: 03/07/2024  Admitted From: Home Disposition: Home  Recommendations for Outpatient Follow-up:  Follow up with PCP in 1-2 weeks Continue azithromycin and cefdinir to complete antibiotic course for community-acquired pneumonia Continue prednisone taper on discharge for RSV induced acute exacerbation of reactive airway disease  Home Health: No Equipment/Devices: None  Discharge Condition: Stable CODE STATUS: Full code Diet recommendation: Heart healthy diet  History of present illness:  Mary Hayes is a 83 y.o. female with past medical history significant for HTN, HLD, hypothyroidism, DM2, OSA, osteoarthritis, degenerative lumbar sacral disease, reactive airway disease, chronic pain syndrome who presented to [facility] ED on 03/05/2024 with progressive shortness of breath over the last 2 days associated with nonproductive cough.  Reports recently diagnosed with RSV few days prior; and since has had progressive symptoms.  Unable to complete a full sentence without catching her breath.  Denies fever, no chills.  Denies tobacco use.   In the ED, temperature 99.2 F, HR 125, RR 28, BP 160/75, SpO2 93% on 4 L nasal cannula.  WBC 8.4, hemoglobin 12.7, platelet count 240.  Sodium 132, potassium 2.5, chloride 96, CO2 23, glucose 200, BUN 11, creatinine 0.62.  AST 38, ALT 32, total bilirubin 0.7.  High sensitive troponin 9>57.  BNP 68.8.  COVID/influenza PCR negative.  RSV PCR positive.  Chest x-ray with low volume chest with atelectasis versus infiltrate over the lower lung fields.  Blood cultures x 2 obtained.  Hospital course:  Acute hypoxic respiratory failure, POA RSV viral infection Community-acquired pneumonia History of reactive airway disease with exacerbation Patient presenting with progressive  shortness of breath, nonproductive cough and conversational dyspnea over the last 2 days after recent diagnosis of RSV.  Patient with temperature 99.2, WBC count of 8.4, BNP 68.8.  RSV PCR positive.  COVID/influenza PCR negative.  Chest x-ray with findings concerning for lower lung infiltrates consistent with pneumonia.  Patient was started on azithromycin and ceftriaxone, supported with IV steroids, scheduled neb treatments.  Patient was able to be titrated off of supplemental oxygen and no desaturation was noted with exertion at time of discharge.  Will continue antibiotics with azithromycin and cefdinir to complete antibiotic course.  Will continue prednisone taper.  Albuterol MDI as needed.  Outpatient follow-up with PCP.   Hypokalemia Repleted during hospitalization.   Essential hypertension Continue home losartan/HCTZ   Hyperlipidemia Atorvastatin 10 mg p.o. daily   Hypothyroidism Levothyroxine 50 mcg p.o. daily   Type 2 diabetes mellitus Hemoglobin A1c 6.5.  Diet controlled at baseline.   OSA Continue nocturnal CPAP   Chronic pain syndrome Degenerative lumbar sacral disease Osteoarthritis Tylenol as needed   Weakness/deconditioning/gait disturbance: Seen by PT and OT during hospitalization with no needs identified.  Discharge Diagnoses:  Principal Problem:   Acute hypoxic respiratory failure (HCC) Active Problems:   RSV infection   CAP (community acquired pneumonia)   Reactive airway disease with acute exacerbation   Non-insulin dependent type 2 diabetes mellitus (HCC)   Chronic pain syndrome   Obstructive sleep apnea   Essential hypertension   Hyperlipidemia   Hypothyroidism    Discharge Instructions  Discharge Instructions     Call MD for:  difficulty breathing, headache or visual disturbances   Complete by: As directed    Call MD for:  extreme fatigue   Complete by: As directed    Call  MD for:  persistant dizziness or light-headedness   Complete by: As  directed    Call MD for:  persistant nausea and vomiting   Complete by: As directed    Call MD for:  severe uncontrolled pain   Complete by: As directed    Call MD for:  temperature >100.4   Complete by: As directed    Diet - low sodium heart healthy   Complete by: As directed    Increase activity slowly   Complete by: As directed       Allergies as of 03/07/2024       Reactions   Lisinopril Other (See Comments)   imflamation of lips        Medication List     TAKE these medications    acetaminophen 650 MG CR tablet Commonly known as: TYLENOL Take 650 mg by mouth every 8 (eight) hours as needed for pain.   albuterol 108 (90 Base) MCG/ACT inhaler Commonly known as: VENTOLIN HFA Inhale 1-2 puffs into the lungs every 4 (four) hours as needed for wheezing or shortness of breath.   atorvastatin 10 MG tablet Commonly known as: LIPITOR Take 10 mg by mouth daily.   azithromycin 500 MG tablet Commonly known as: Zithromax Take 1 tablet (500 mg total) by mouth daily for 3 days. Start taking on: March 08, 2024   benzonatate 200 MG capsule Commonly known as: TESSALON Take 200 mg by mouth 3 (three) times daily as needed for cough (for 10 days).   cefdinir 300 MG capsule Commonly known as: OMNICEF Take 1 capsule (300 mg total) by mouth 2 (two) times daily for 4 days. Start taking on: March 08, 2024   cholecalciferol 25 MCG (1000 UNIT) tablet Commonly known as: VITAMIN D3 Take 1,000 Units by mouth daily.   guaiFENesin 600 MG 12 hr tablet Commonly known as: MUCINEX Take 1 tablet (600 mg total) by mouth 2 (two) times daily for 7 days.   guaiFENesin-dextromethorphan 100-10 MG/5ML syrup Commonly known as: ROBITUSSIN DM Take 5 mLs by mouth every 4 (four) hours as needed for cough (chest congestion).   hydrochlorothiazide 25 MG tablet Commonly known as: HYDRODIURIL Take 1 tablet (25 mg total) by mouth daily.   hydrocortisone cream 1 % Apply 1 application topically 3  (three) times daily as needed for itching.   levothyroxine 50 MCG tablet Commonly known as: SYNTHROID Take 50 mcg by mouth daily before breakfast.   losartan 50 MG tablet Commonly known as: COZAAR Take 50 mg by mouth daily.   meloxicam 7.5 MG tablet Commonly known as: MOBIC Take 7.5 mg by mouth daily as needed for pain.   multivitamin with minerals Tabs tablet Take 1 tablet by mouth daily.   naproxen 375 MG tablet Commonly known as: NAPROSYN Take 375 mg by mouth 2 (two) times daily as needed for pain.   polyvinyl alcohol 1.4 % ophthalmic solution Commonly known as: LIQUIFILM TEARS Place 1 drop into both eyes as needed for dry eyes.   predniSONE 10 MG tablet Commonly known as: DELTASONE Take 4 tablets (40 mg total) by mouth daily for 2 days, THEN 3 tablets (30 mg total) daily for 2 days, THEN 2 tablets (20 mg total) daily for 2 days, THEN 1 tablet (10 mg total) daily for 2 days. Start taking on: March 08, 2024   sodium chloride 0.65 % Soln nasal spray Commonly known as: OCEAN Place 1 spray into both nostrils as needed for congestion.  Follow-up Information     [facility] [place]. Schedule an appointment as soon as possible for a visit in 1 week(s).   Specialty: Family Medicine Contact information: [address] [place] [address] [phone]                Allergies  Allergen Reactions   Lisinopril Other (See Comments)    imflamation of lips    Consultations: None   Procedures/Studies: DG Chest Port 1 View Result Date: 03/05/2024 CLINICAL DATA:  Shortness of breath EXAM: PORTABLE CHEST 1 VIEW COMPARISON:  02/20/2021 FINDINGS: Limited by kyphotic positioning. The chin overlaps the medial apices. Low volume chest with diffuse interstitial coarsening and hazy density at the bases. No Kerley lines, effusion, or pneumothorax. Stable heart size and mediastinal contours. IMPRESSION: Low volume chest with atelectasis or  infiltrate over the lower lungs, accentuated by kyphotic positioning. Electronically Signed   By: Tiburcio Pea M.D.   On: 03/05/2024 04:38     Subjective: Patient seen examined bedside, lying in bed.  No complaints this morning.  Remains off of some meloxicam.  Ready for discharge home.  Endorses productive cough.  Denies headache, no dizziness, no chest pain, no palpitations, no shortness of breath, no nausea/vomiting/diarrhea, no fever/chills/night sweats, no abdominal pain, no focal weakness, no fatigue, no paresthesias.  No acute events overnight per nursing.  Discharge Exam: Vitals:   03/07/24 0741 03/07/24 0815  BP:  (!) 142/75  Pulse:  71  Resp:  20  Temp:  98 F (36.7 C)  SpO2: 95% 93%   Vitals:   03/06/24 2008 03/07/24 0440 03/07/24 0741 03/07/24 0815  BP:  131/78  (!) 142/75  Pulse:  60  71  Resp:  17  20  Temp:  (!) 97.4 F (36.3 C)  98 F (36.7 C)  TempSrc:  Oral  Oral  SpO2: 96% 99% 95% 93%  Weight:      Height:        Physical Exam: GEN: NAD, alert and oriented x 3, wd/wn HEENT: NCAT, PERRL, EOMI, sclera clear, MMM PULM: CTAB w/o wheezes/crackles, normal respiratory effort, on room air CV: RRR w/o M/G/R GI: abd soft, NTND, NABS, no R/G/M MSK: no peripheral edema, muscle strength globally intact 5/5 bilateral upper/lower extremities NEURO: CN II-XII intact, no focal deficits, sensation to light touch intact PSYCH: normal mood/affect Integumentary: dry/intact, no rashes or wounds    The results of significant diagnostics from this hospitalization (including imaging, microbiology, ancillary and laboratory) are listed below for reference.     Microbiology: Recent Results (from the past 240 hours)  Resp panel by RT-PCR (RSV, Flu A&B, Covid) Anterior Nasal Swab     Status: Abnormal   Collection Time: 03/05/24  3:19 AM   Specimen: Anterior Nasal Swab  Result Value Ref Range Status   SARS Coronavirus 2 by RT PCR NEGATIVE NEGATIVE Final    Comment:  (NOTE) SARS-CoV-2 target nucleic acids are NOT DETECTED.  The SARS-CoV-2 RNA is generally detectable in upper respiratory specimens during the acute phase of infection. The lowest concentration of SARS-CoV-2 viral copies this assay can detect is 138 copies/mL. A negative result does not preclude SARS-Cov-2 infection and should not be used as the sole basis for treatment or other patient management decisions. A negative result may occur with  improper specimen collection/handling, submission of specimen other than nasopharyngeal swab, presence of viral mutation(s) within the areas targeted by this assay, and inadequate number of viral copies(<138 copies/mL). A  negative result must be combined with clinical observations, patient history, and epidemiological information. The expected result is Negative.  Fact Sheet for Patients:  BloggerCourse.com  Fact Sheet for Healthcare Providers:  SeriousBroker.it  This test is no t yet approved or cleared by the [place] FDA and  has been authorized for detection and/or diagnosis of SARS-CoV-2 by FDA under an Emergency Use Authorization (EUA). This EUA will remain  in effect (meaning this test can be used) for the duration of the COVID-19 declaration under Section 564(b)(1) of the Act, 21 U.S.C.section 360bbb-3(b)(1), unless the authorization is terminated  or revoked sooner.       Influenza A by PCR NEGATIVE NEGATIVE Final   Influenza B by PCR NEGATIVE NEGATIVE Final    Comment: (NOTE) The Xpert Xpress SARS-CoV-2/FLU/RSV plus assay is intended as an aid in the diagnosis of influenza from Nasopharyngeal swab specimens and should not be used as a sole basis for treatment. Nasal washings and aspirates are unacceptable for Xpert Xpress SARS-CoV-2/FLU/RSV testing.  Fact Sheet for Patients: BloggerCourse.com  Fact Sheet for Healthcare  Providers: SeriousBroker.it  This test is not yet approved or cleared by the [place] FDA and has been authorized for detection and/or diagnosis of SARS-CoV-2 by FDA under an Emergency Use Authorization (EUA). This EUA will remain in effect (meaning this test can be used) for the duration of the COVID-19 declaration under Section 564(b)(1) of the Act, 21 U.S.C. section 360bbb-3(b)(1), unless the authorization is terminated or revoked.     Resp Syncytial Virus by PCR POSITIVE (A) NEGATIVE Final    Comment: (NOTE) Fact Sheet for Patients: BloggerCourse.com  Fact Sheet for Healthcare Providers: SeriousBroker.it  This test is not yet approved or cleared by the [place] FDA and has been authorized for detection and/or diagnosis of SARS-CoV-2 by FDA under an Emergency Use Authorization (EUA). This EUA will remain in effect (meaning this test can be used) for the duration of the COVID-19 declaration under Section 564(b)(1) of the Act, 21 U.S.C. section 360bbb-3(b)(1), unless the authorization is terminated or revoked.  Performed at [facility], [address] [place], [place] [address]   Culture, blood (routine x 2) Call MD if unable to obtain prior to antibiotics being given     Status: None (Preliminary result)   Collection Time: 03/05/24  5:11 AM   Specimen: BLOOD  Result Value Ref Range Status   Specimen Description   Final    BLOOD LEFT ANTECUBITAL Performed at [facility], [address] [place], [place] [address]    Special Requests   Final    BOTTLES DRAWN AEROBIC AND ANAEROBIC Blood Culture results may not be optimal due to an inadequate volume of blood received in culture bottles Performed at [facility], [address], [place], [place] [address]    Culture   Final    NO GROWTH 2 DAYS Performed at [facility],  [address] [place], [place] [address]    Report Status PENDING  Incomplete  Culture, blood (routine x 2) Call MD if unable to obtain prior to antibiotics being given     Status: None (Preliminary result)   Collection Time: 03/05/24  5:12 AM   Specimen: BLOOD LEFT HAND  Result Value Ref Range Status   Specimen Description   Final    BLOOD LEFT HAND Performed at [facility], [address] [place], [place] [address]    Special Requests   Final  BOTTLES DRAWN AEROBIC AND ANAEROBIC Blood Culture results may not be optimal due to an inadequate volume of blood received in culture bottles Performed at [facility], [address], [place], [place] [address]    Culture   Final    NO GROWTH 2 DAYS Performed at [facility], [address] [place], [place] [address]    Report Status PENDING  Incomplete     Labs: BNP (last 3 results) Recent Labs    03/05/24 0318  BNP 68.8   Basic Metabolic Panel: Recent Labs  Lab 03/05/24 0438 03/05/24 0541 03/06/24 0630 03/07/24 0525  NA 132*  --  136 139  K 2.5*  --  3.8 4.0  CL 96*  --  104 105  CO2 23  --  23 24  GLUCOSE 200*  --  142* 100*  BUN 11  --  17 26*  CREATININE 0.63  --  0.61 0.79  CALCIUM 8.2*  --  8.5* 8.7*  MG  --  2.1 2.1  --    Liver Function Tests: Recent Labs  Lab 03/05/24 0438 03/06/24 0630  AST 38 29  ALT 32 30  ALKPHOS 35* 33*  BILITOT 0.7 0.4  PROT 7.0 6.8  ALBUMIN 3.6 3.2*   No results for input(s): "LIPASE", "AMYLASE" in the last 168 hours. No results for input(s): "AMMONIA" in the last 168 hours. CBC: Recent Labs  Lab 03/05/24 0318 03/06/24 0630 03/07/24 0525  WBC 8.4 11.3* 13.3*  NEUTROABS 4.8  --   --   HGB 12.7 11.5* 11.7*  HCT 39.1 35.8* 37.3  MCV 87.5 89.1 89.9  PLT 240 228 253   Cardiac Enzymes: No results for input(s): "CKTOTAL", "CKMB", "CKMBINDEX", "TROPONINI" in the last 168 hours. BNP: Invalid input(s): "POCBNP" CBG: Recent Labs  Lab  03/06/24 0748 03/06/24 1131 03/06/24 1721 03/06/24 2043 03/07/24 0804  GLUCAP 163* 220* 139* 206* 149*   D-Dimer No results for input(s): "DDIMER" in the last 72 hours. Hgb A1c Recent Labs    03/05/24 0542  HGBA1C 6.5*   Lipid Profile No results for input(s): "CHOL", "HDL", "LDLCALC", "TRIG", "CHOLHDL", "LDLDIRECT" in the last 72 hours. Thyroid function studies No results for input(s): "TSH", "T4TOTAL", "T3FREE", "THYROIDAB" in the last 72 hours.  Invalid input(s): "FREET3" Anemia work up No results for input(s): "VITAMINB12", "FOLATE", "FERRITIN", "TIBC", "IRON", "RETICCTPCT" in the last 72 hours. Urinalysis    Component Value Date/Time   COLORURINE YELLOW 02/21/2010 0920   APPEARANCEUR TURBID (A) 02/21/2010 0920   LABSPEC 1.019 02/21/2010 0920   PHURINE 6.5 02/21/2010 0920   GLUCOSEU NEGATIVE 02/21/2010 0920   HGBUR MODERATE (A) 02/21/2010 0920   BILIRUBINUR NEGATIVE 02/21/2010 0920   KETONESUR NEGATIVE 02/21/2010 0920   PROTEINUR NEGATIVE 02/21/2010 0920   UROBILINOGEN 0.2 02/21/2010 0920   NITRITE NEGATIVE 02/21/2010 0920   LEUKOCYTESUR NEGATIVE 02/21/2010 0920   Sepsis Labs Recent Labs  Lab 03/05/24 0318 03/06/24 0630 03/07/24 0525  WBC 8.4 11.3* 13.3*   Microbiology Recent Results (from the past 240 hours)  Resp panel by RT-PCR (RSV, Flu A&B, Covid) Anterior Nasal Swab     Status: Abnormal   Collection Time: 03/05/24  3:19 AM   Specimen: Anterior Nasal Swab  Result Value Ref Range Status   SARS Coronavirus 2 by RT PCR NEGATIVE NEGATIVE Final    Comment: (NOTE) SARS-CoV-2 target nucleic acids are NOT DETECTED.  The SARS-CoV-2 RNA is generally detectable in upper respiratory specimens during the acute phase of infection.  The lowest concentration of SARS-CoV-2 viral copies this assay can detect is 138 copies/mL. A negative result does not preclude SARS-Cov-2 infection and should not be used as the sole basis for treatment or other patient management  decisions. A negative result may occur with  improper specimen collection/handling, submission of specimen other than nasopharyngeal swab, presence of viral mutation(s) within the areas targeted by this assay, and inadequate number of viral copies(<138 copies/mL). A negative result must be combined with clinical observations, patient history, and epidemiological information. The expected result is Negative.  Fact Sheet for Patients:  BloggerCourse.com  Fact Sheet for Healthcare Providers:  SeriousBroker.it  This test is no t yet approved or cleared by the [place] FDA and  has been authorized for detection and/or diagnosis of SARS-CoV-2 by FDA under an Emergency Use Authorization (EUA). This EUA will remain  in effect (meaning this test can be used) for the duration of the COVID-19 declaration under Section 564(b)(1) of the Act, 21 U.S.C.section 360bbb-3(b)(1), unless the authorization is terminated  or revoked sooner.       Influenza A by PCR NEGATIVE NEGATIVE Final   Influenza B by PCR NEGATIVE NEGATIVE Final    Comment: (NOTE) The Xpert Xpress SARS-CoV-2/FLU/RSV plus assay is intended as an aid in the diagnosis of influenza from Nasopharyngeal swab specimens and should not be used as a sole basis for treatment. Nasal washings and aspirates are unacceptable for Xpert Xpress SARS-CoV-2/FLU/RSV testing.  Fact Sheet for Patients: BloggerCourse.com  Fact Sheet for Healthcare Providers: SeriousBroker.it  This test is not yet approved or cleared by the [place] FDA and has been authorized for detection and/or diagnosis of SARS-CoV-2 by FDA under an Emergency Use Authorization (EUA). This EUA will remain in effect (meaning this test can be used) for the duration of the COVID-19 declaration under Section 564(b)(1) of the Act, 21 U.S.C. section 360bbb-3(b)(1), unless the  authorization is terminated or revoked.     Resp Syncytial Virus by PCR POSITIVE (A) NEGATIVE Final    Comment: (NOTE) Fact Sheet for Patients: BloggerCourse.com  Fact Sheet for Healthcare Providers: SeriousBroker.it  This test is not yet approved or cleared by the [place] FDA and has been authorized for detection and/or diagnosis of SARS-CoV-2 by FDA under an Emergency Use Authorization (EUA). This EUA will remain in effect (meaning this test can be used) for the duration of the COVID-19 declaration under Section 564(b)(1) of the Act, 21 U.S.C. section 360bbb-3(b)(1), unless the authorization is terminated or revoked.  Performed at [facility], [address] [place], [place] [address]   Culture, blood (routine x 2) Call MD if unable to obtain prior to antibiotics being given     Status: None (Preliminary result)   Collection Time: 03/05/24  5:11 AM   Specimen: BLOOD  Result Value Ref Range Status   Specimen Description   Final    BLOOD LEFT ANTECUBITAL Performed at [facility], [address] [place], [place] [address]    Special Requests   Final    BOTTLES DRAWN AEROBIC AND ANAEROBIC Blood Culture results may not be optimal due to an inadequate volume of blood received in culture bottles Performed at [facility], [address], [place], [place] [address]    Culture   Final    NO GROWTH 2 DAYS Performed at [facility], [address] [place], [place] [address]    Report Status PENDING  Incomplete  Culture, blood (routine x  2) Call MD if unable to obtain prior to antibiotics being given     Status: None (Preliminary result)   Collection Time: 03/05/24  5:12 AM   Specimen: BLOOD LEFT HAND  Result Value Ref Range Status   Specimen Description   Final    BLOOD LEFT HAND Performed at [facility], [address], [place], [place] [address]     Special Requests   Final    BOTTLES DRAWN AEROBIC AND ANAEROBIC Blood Culture results may not be optimal due to an inadequate volume of blood received in culture bottles Performed at [facility], [address] [place], [place] [address]    Culture   Final    NO GROWTH 2 DAYS Performed at [facility], [address] [place], [place] [address]    Report Status PENDING  Incomplete     Time coordinating discharge: Over 30 minutes  SIGNED:   Alvira Philips [place], DO  [facility] 03/07/2024, 10:02 AM

## 2024-03-10 LAB — CULTURE, BLOOD (ROUTINE X 2)
Culture: NO GROWTH
Culture: NO GROWTH

## 2024-03-17 DIAGNOSIS — Z09 Encounter for follow-up examination after completed treatment for conditions other than malignant neoplasm: Secondary | ICD-10-CM | POA: Diagnosis not present

## 2024-03-17 DIAGNOSIS — J189 Pneumonia, unspecified organism: Secondary | ICD-10-CM | POA: Diagnosis not present

## 2024-03-17 DIAGNOSIS — R0600 Dyspnea, unspecified: Secondary | ICD-10-CM | POA: Diagnosis not present

## 2024-04-14 DIAGNOSIS — G4733 Obstructive sleep apnea (adult) (pediatric): Secondary | ICD-10-CM | POA: Diagnosis not present

## 2024-04-14 DIAGNOSIS — G4700 Insomnia, unspecified: Secondary | ICD-10-CM | POA: Diagnosis not present

## 2024-08-11 DIAGNOSIS — G4733 Obstructive sleep apnea (adult) (pediatric): Secondary | ICD-10-CM | POA: Diagnosis not present

## 2024-08-11 DIAGNOSIS — F418 Other specified anxiety disorders: Secondary | ICD-10-CM | POA: Diagnosis not present

## 2024-08-11 DIAGNOSIS — G4700 Insomnia, unspecified: Secondary | ICD-10-CM | POA: Diagnosis not present

## 2024-09-01 DIAGNOSIS — Z961 Presence of intraocular lens: Secondary | ICD-10-CM | POA: Diagnosis not present

## 2024-09-01 DIAGNOSIS — H43813 Vitreous degeneration, bilateral: Secondary | ICD-10-CM | POA: Diagnosis not present

## 2024-09-01 DIAGNOSIS — H353131 Nonexudative age-related macular degeneration, bilateral, early dry stage: Secondary | ICD-10-CM | POA: Diagnosis not present

## 2024-09-01 DIAGNOSIS — H04123 Dry eye syndrome of bilateral lacrimal glands: Secondary | ICD-10-CM | POA: Diagnosis not present

## 2024-09-01 DIAGNOSIS — H35373 Puckering of macula, bilateral: Secondary | ICD-10-CM | POA: Diagnosis not present

## 2024-09-08 DIAGNOSIS — G4720 Circadian rhythm sleep disorder, unspecified type: Secondary | ICD-10-CM | POA: Diagnosis not present

## 2024-09-08 DIAGNOSIS — G4733 Obstructive sleep apnea (adult) (pediatric): Secondary | ICD-10-CM | POA: Diagnosis not present

## 2024-09-08 DIAGNOSIS — F411 Generalized anxiety disorder: Secondary | ICD-10-CM | POA: Diagnosis not present

## 2024-10-20 DIAGNOSIS — E119 Type 2 diabetes mellitus without complications: Secondary | ICD-10-CM | POA: Diagnosis not present

## 2024-10-20 DIAGNOSIS — E559 Vitamin D deficiency, unspecified: Secondary | ICD-10-CM | POA: Diagnosis not present

## 2024-10-20 DIAGNOSIS — G4700 Insomnia, unspecified: Secondary | ICD-10-CM | POA: Diagnosis not present

## 2024-10-20 DIAGNOSIS — I10 Essential (primary) hypertension: Secondary | ICD-10-CM | POA: Diagnosis not present

## 2024-10-20 DIAGNOSIS — E039 Hypothyroidism, unspecified: Secondary | ICD-10-CM | POA: Diagnosis not present

## 2024-10-20 DIAGNOSIS — Z0000 Encounter for general adult medical examination without abnormal findings: Secondary | ICD-10-CM | POA: Diagnosis not present

## 2024-10-20 DIAGNOSIS — E782 Mixed hyperlipidemia: Secondary | ICD-10-CM | POA: Diagnosis not present

## 2024-10-27 DIAGNOSIS — Z1331 Encounter for screening for depression: Secondary | ICD-10-CM | POA: Diagnosis not present

## 2024-10-27 DIAGNOSIS — Z0000 Encounter for general adult medical examination without abnormal findings: Secondary | ICD-10-CM | POA: Diagnosis not present

## 2024-10-27 DIAGNOSIS — Z23 Encounter for immunization: Secondary | ICD-10-CM | POA: Diagnosis not present

## 2024-11-10 DIAGNOSIS — Z1231 Encounter for screening mammogram for malignant neoplasm of breast: Secondary | ICD-10-CM | POA: Diagnosis not present

## 2024-11-26 DIAGNOSIS — N6489 Other specified disorders of breast: Secondary | ICD-10-CM | POA: Diagnosis not present

## 2024-11-26 DIAGNOSIS — R928 Other abnormal and inconclusive findings on diagnostic imaging of breast: Secondary | ICD-10-CM | POA: Diagnosis not present
